# Patient Record
Sex: Female | Born: 1990
Health system: Southern US, Community
[De-identification: ages and names within clinical notes are randomized; demographics above are authoritative.]

## PROBLEM LIST (undated history)

## (undated) DIAGNOSIS — I2699 Other pulmonary embolism without acute cor pulmonale: Secondary | ICD-10-CM

## (undated) DIAGNOSIS — F32A Depression, unspecified: Secondary | ICD-10-CM

## (undated) DIAGNOSIS — F419 Anxiety disorder, unspecified: Secondary | ICD-10-CM

## (undated) DIAGNOSIS — F329 Major depressive disorder, single episode, unspecified: Secondary | ICD-10-CM

## (undated) DIAGNOSIS — T7840XA Allergy, unspecified, initial encounter: Secondary | ICD-10-CM

## (undated) HISTORY — DX: Other pulmonary embolism without acute cor pulmonale: I26.99

## (undated) HISTORY — DX: Anxiety disorder, unspecified: F41.9

## (undated) HISTORY — DX: Allergy, unspecified, initial encounter: T78.40XA

## (undated) HISTORY — DX: Depression, unspecified: F32.A

## (undated) HISTORY — DX: Major depressive disorder, single episode, unspecified: F32.9

---
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
## 2009-01-22 ENCOUNTER — Encounter: Payer: Self-pay | Admitting: Physician Assistant

## 2009-01-22 ENCOUNTER — Ambulatory Visit: Payer: Self-pay | Admitting: Family

## 2009-01-22 LAB — CONVERTED CEMR LAB
Antibody Screen: NEGATIVE
Rh Type: POSITIVE

## 2009-01-23 ENCOUNTER — Encounter: Payer: Self-pay | Admitting: Physician Assistant

## 2009-02-03 ENCOUNTER — Ambulatory Visit (HOSPITAL_COMMUNITY): Admission: RE | Admit: 2009-02-03 | Discharge: 2009-02-03 | Payer: Self-pay | Admitting: Obstetrics & Gynecology

## 2009-02-10 ENCOUNTER — Ambulatory Visit: Payer: Self-pay | Admitting: Family Medicine

## 2009-02-22 ENCOUNTER — Ambulatory Visit: Payer: Self-pay | Admitting: Family

## 2009-02-23 ENCOUNTER — Encounter: Payer: Self-pay | Admitting: Family

## 2009-02-23 LAB — CONVERTED CEMR LAB
Chlamydia, DNA Probe: NEGATIVE
GC Probe Amp, Genital: NEGATIVE
Trich, Wet Prep: NONE SEEN

## 2009-02-25 ENCOUNTER — Ambulatory Visit (HOSPITAL_COMMUNITY): Admission: RE | Admit: 2009-02-25 | Discharge: 2009-02-25 | Payer: Self-pay | Admitting: Obstetrics & Gynecology

## 2009-03-10 ENCOUNTER — Ambulatory Visit (HOSPITAL_COMMUNITY): Admission: RE | Admit: 2009-03-10 | Discharge: 2009-03-10 | Payer: Self-pay | Admitting: Obstetrics & Gynecology

## 2009-03-22 ENCOUNTER — Ambulatory Visit: Payer: Self-pay | Admitting: Family

## 2009-04-08 ENCOUNTER — Ambulatory Visit (HOSPITAL_COMMUNITY): Admission: RE | Admit: 2009-04-08 | Discharge: 2009-04-08 | Payer: Self-pay | Admitting: Obstetrics & Gynecology

## 2009-04-19 ENCOUNTER — Ambulatory Visit: Payer: Self-pay | Admitting: Family

## 2009-04-23 ENCOUNTER — Ambulatory Visit: Payer: Self-pay | Admitting: Physician Assistant

## 2009-05-21 ENCOUNTER — Ambulatory Visit: Payer: Self-pay | Admitting: Family

## 2009-05-21 LAB — CONVERTED CEMR LAB
HCT: 36.9 % (ref 36.0–46.0)
Hemoglobin: 12.6 g/dL (ref 12.0–15.0)
MCHC: 34.1 g/dL (ref 30.0–36.0)
MCV: 92.9 fL (ref 78.0–100.0)
Platelets: 170 10*3/uL (ref 150–400)
RBC: 3.97 M/uL (ref 3.87–5.11)
RDW: 13.5 % (ref 11.5–15.5)
WBC: 11.7 10*3/uL — ABNORMAL HIGH (ref 4.0–10.5)

## 2009-06-04 ENCOUNTER — Ambulatory Visit: Payer: Self-pay | Admitting: Obstetrics and Gynecology

## 2009-06-08 ENCOUNTER — Ambulatory Visit: Payer: Self-pay | Admitting: Advanced Practice Midwife

## 2009-06-08 ENCOUNTER — Inpatient Hospital Stay (HOSPITAL_COMMUNITY): Admission: AD | Admit: 2009-06-08 | Discharge: 2009-06-09 | Payer: Self-pay | Admitting: Family Medicine

## 2009-06-25 ENCOUNTER — Ambulatory Visit: Payer: Self-pay | Admitting: Physician Assistant

## 2009-06-28 ENCOUNTER — Inpatient Hospital Stay (HOSPITAL_COMMUNITY): Admission: AD | Admit: 2009-06-28 | Discharge: 2009-06-28 | Payer: Self-pay | Admitting: Obstetrics & Gynecology

## 2009-06-28 ENCOUNTER — Ambulatory Visit: Payer: Self-pay | Admitting: Obstetrics and Gynecology

## 2009-07-09 ENCOUNTER — Ambulatory Visit: Payer: Self-pay | Admitting: Physician Assistant

## 2009-07-16 ENCOUNTER — Ambulatory Visit: Payer: Self-pay | Admitting: Obstetrics and Gynecology

## 2009-07-16 LAB — CONVERTED CEMR LAB
Chlamydia, DNA Probe: NEGATIVE
Clue Cells Wet Prep HPF POC: NONE SEEN
GC Probe Amp, Genital: NEGATIVE
Trich, Wet Prep: NONE SEEN

## 2009-07-17 ENCOUNTER — Encounter: Payer: Self-pay | Admitting: Obstetrics and Gynecology

## 2009-07-23 ENCOUNTER — Ambulatory Visit: Payer: Self-pay | Admitting: Obstetrics and Gynecology

## 2009-08-02 ENCOUNTER — Ambulatory Visit: Payer: Self-pay | Admitting: Physician Assistant

## 2009-08-04 ENCOUNTER — Ambulatory Visit: Payer: Self-pay | Admitting: Obstetrics & Gynecology

## 2009-08-08 ENCOUNTER — Inpatient Hospital Stay (HOSPITAL_COMMUNITY): Admission: AD | Admit: 2009-08-08 | Discharge: 2009-08-11 | Payer: Self-pay | Admitting: Obstetrics and Gynecology

## 2009-08-08 ENCOUNTER — Ambulatory Visit: Payer: Self-pay | Admitting: Obstetrics and Gynecology

## 2009-08-09 ENCOUNTER — Encounter: Payer: Self-pay | Admitting: Obstetrics and Gynecology

## 2009-08-13 HISTORY — PX: CHOLECYSTECTOMY: SHX55

## 2009-09-20 ENCOUNTER — Ambulatory Visit: Payer: Self-pay | Admitting: Advanced Practice Midwife

## 2009-11-09 ENCOUNTER — Ambulatory Visit: Payer: Self-pay | Admitting: Obstetrics & Gynecology

## 2010-01-10 ENCOUNTER — Ambulatory Visit: Payer: Self-pay | Admitting: Family

## 2010-01-10 ENCOUNTER — Other Ambulatory Visit: Admission: RE | Admit: 2010-01-10 | Discharge: 2010-01-10 | Payer: Self-pay | Admitting: Obstetrics & Gynecology

## 2010-01-11 ENCOUNTER — Encounter: Payer: Self-pay | Admitting: Family

## 2010-01-11 LAB — CONVERTED CEMR LAB
Free T4: 1.08 ng/dL (ref 0.80–1.80)
T3, Free: 3.5 pg/mL (ref 2.3–4.2)

## 2010-03-02 ENCOUNTER — Ambulatory Visit: Payer: Self-pay | Admitting: Family Medicine

## 2010-03-02 LAB — CONVERTED CEMR LAB: Rapid Strep: NEGATIVE

## 2010-10-31 ENCOUNTER — Ambulatory Visit: Payer: Self-pay | Admitting: Family Medicine

## 2010-11-18 ENCOUNTER — Inpatient Hospital Stay (HOSPITAL_COMMUNITY)
Admission: AD | Admit: 2010-11-18 | Discharge: 2010-11-18 | Payer: Self-pay | Source: Home / Self Care | Attending: Family Medicine | Admitting: Family Medicine

## 2010-11-28 LAB — URINALYSIS, ROUTINE W REFLEX MICROSCOPIC
Bilirubin Urine: NEGATIVE
Ketones, ur: NEGATIVE mg/dL
Leukocytes, UA: NEGATIVE
Nitrite: NEGATIVE
Protein, ur: NEGATIVE mg/dL
Specific Gravity, Urine: 1.01 (ref 1.005–1.030)
Urine Glucose, Fasting: NEGATIVE mg/dL
Urobilinogen, UA: 0.2 mg/dL (ref 0.0–1.0)
pH: 7 (ref 5.0–8.0)

## 2010-11-28 LAB — GC/CHLAMYDIA PROBE AMP, GENITAL
Chlamydia, DNA Probe: NEGATIVE
GC Probe Amp, Genital: NEGATIVE

## 2010-11-28 LAB — WET PREP, GENITAL
Clue Cells Wet Prep HPF POC: NONE SEEN
Trich, Wet Prep: NONE SEEN
Yeast Wet Prep HPF POC: NONE SEEN

## 2010-11-28 LAB — URINE MICROSCOPIC-ADD ON

## 2010-11-28 LAB — POCT PREGNANCY, URINE: Preg Test, Ur: NEGATIVE

## 2010-12-04 ENCOUNTER — Encounter: Payer: Self-pay | Admitting: Family Medicine

## 2010-12-13 NOTE — Assessment & Plan Note (Signed)
Summary: FEVER AND SORE THROAT   Vital Signs:  Patient Profile:   20 Years Old Female CC:      fever, sore throat, body aches, sinus congestion  X 1 day Height:     65 inches Weight:      136 pounds O2 Sat:      98 % O2 treatment:    Room Air Temp:     101.4 degrees F oral Pulse rate:   120 / minute Pulse rhythm:   regular Resp:     14 per minute BP sitting:   108 / 75  (right arm) Cuff size:   regular  Pt. in pain?   yes    Location:   throat    Type:       sore  Vitals Entered By: Lajean Saver RN (March 02, 2010 9:29 AM)                   Prior Medication List:  ALLEGRA-D 12 HOUR 60-120 MG  TB12 (FEXOFENADINE-PSEUDOEPHEDRINE) 1 tab by mouth q 12 hrs as needed allergies FLONASE 50 MCG/ACT  SUSP (FLUTICASONE PROPIONATE) 2 sprays per nostril daily CIPRO 500 MG TABS (CIPROFLOXACIN HCL) 1 tab by mouth q 12 hrs x 5 days PYRIDIUM 200 MG TABS (PHENAZOPYRIDINE HCL) 1 tab by mouth three times a day x 2 days ONDANSETRON 8 MG TBDP (ONDANSETRON) 1 tab by mouth q 8 hrs as needed nausea   Updated Prior Medication List: No Medications Current Allergies (reviewed today): ! * SEASONALHistory of Present Illness Chief Complaint: fever, sore throat, body aches, sinus congestion  X 1 day History of Present Illness: Patient has had similar presentation before and then it was strep. Became sick yesterday w/ an elevated fever.  Current Problems: FEVER (ICD-780.60) ACUTE NASOPHARYNGITIS (ICD-460) NEED PROPHYLACTIC VACCINATION&INOCULATION FLU (ICD-V04.81) UTI'S, RECURRENT (ICD-599.0) URINARY TRACT INFECTION SITE NOT SPECIFIED (ICD-599.0) ALLERGIC RHINITIS CAUSE UNSPECIFIED (ICD-477.9)   REVIEW OF SYSTEMS Constitutional Symptoms       Complains of fever and fatigue.     Denies chills, night sweats, weight loss, and weight gain.      Comments: body aches Eyes       Denies change in vision, eye pain, eye discharge, glasses, contact lenses, and eye surgery. Ear/Nose/Throat/Mouth  Complains of sinus problems and sore throat.      Denies hearing loss/aids, change in hearing, ear pain, ear discharge, dizziness, frequent runny nose, frequent nose bleeds, hoarseness, and tooth pain or bleeding.  Respiratory       Denies dry cough, productive cough, wheezing, shortness of breath, asthma, bronchitis, and emphysema/COPD.  Cardiovascular       Denies murmurs, chest pain, and tires easily with exhertion.    Gastrointestinal       Denies stomach pain, nausea/vomiting, diarrhea, constipation, blood in bowel movements, and indigestion. Genitourniary       Denies painful urination, kidney stones, and loss of urinary control. Neurological       Denies paralysis, seizures, and fainting/blackouts. Musculoskeletal       Denies muscle pain, joint pain, joint stiffness, decreased range of motion, redness, swelling, muscle weakness, and gout.  Skin       Denies bruising, unusual mles/lumps or sores, and hair/skin or nail changes.  Psych       Denies mood changes, temper/anger issues, anxiety/stress, speech problems, depression, and sleep problems. 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 HS diploma. non-smoker Denies ETOH or drugs. Sexually active. 5 caffeine/ day. No regular exercise. 65 month old daughter  Risk Factors: Smoking Status: current (04/07/2008) Packs/Day: .25 (04/07/2008)  Past Medical History: depression/ anxiety allergies 7 months post partum  Past Surgical History:  Cholecystectomy 08/2009  Family History: Reviewed history from 04/07/2008 and no changes required. father DM, HTN, high chol mother depression brother depression sister healthy  Social History: Reviewed history from 04/07/2008 and no changes required. Office  Asst for [employer]. HS diploma. non-smoker Denies ETOH or drugs. Sexually active. 5 caffeine/ day. No regular exercise. 54 month old daughter Physical Exam General appearance: well developed, well nourished, no acute distress  Head: normocephalic, atraumatic Ears: normal, no lesions or deformities Nasal: pale, boggy, swollen nasal turbinates no tenderness over maxillatry sinuses Oral/Pharynx: unable to visualize tonsillar bed    Neck: neck supple,  trachea midline, no masses Skin: no obvious rashes or lesions MSE: oriented to time, place, and person not toxicnin apperenxce at this time  multiple attempts made by me to visualize tonsil bed and get a culture of throayt and patient unable /unwilling to cooperate. At this time not sure how good a specimen on the raopid was.Discused w/ patient and will give La Bicillin . Explained this uis only for strep and if something else is causing the fever she wikllnot be better and to be reasse in 48-72 hours if not bettter. Assessment New Problems: FEVER (ICD-780.60) ACUTE NASOPHARYNGITIS (ICD-460)  nasopharyngitis  Patient Education: Patient and/or caregiver instructed in the following: rest fluids and Tylenol.  Plan New Orders: Rapid Strep [98119] New Patient Level III [99203] Bicillin CR 1.2 million units Injection [J0558] Admin of Therapeutic Inj  intramuscular or subcutaneous [96372] Follow Up: Follow up on an as needed basis Work/School Excuse: Return to work/school in 2 days  The patient and/or caregiver has been counseled thoroughly with regard to medications prescribed including dosage, schedule, interactions, rationale for use, and possible side effects and they verbalize understanding.  Diagnoses and expected course of recovery discussed and will return if not improved as expected or if the condition worsens. Patient and/or caregiver verbalized understanding.   Patient Instructions: 1)  Recommended remaining out of  work for today and tomorrow 2)  Please schedule an appointment with your primary doctor in :48-72 hr sif not better. 3)  Patient given an injection of La Bicillin 1.2 million units IM w/she understanding if this is not strep she willl not get better and will need follow up.  Laboratory Results  Date/Time Received: March 02, 2010 10:30 AM  Date/Time Reported: March 02, 2010 10:30 AM   Other Tests  Rapid Strep: negative  Kit Test Internal QC: Negative   (Normal Range: Negative)    Medication Administration  Injection # 1:    Medication: Bicillin CR 1.2 million units Injection    Diagnosis: FEVER (ICD-780.60)    Route: IM    Site: RUOQ gluteus    Exp Date: 08/12/2012    Lot #: [id number]    Mfr: king    Patient tolerated injection without complications    Given by: Lajean Saver RN (March 02, 2010 10:31 AM)  Orders Added: 1)  Rapid Strep [95621] 2)  New Patient Level III [99203] 3)  Bicillin CR 1.2 million units Injection [J0558] 4)  Admin of Therapeutic Inj  intramuscular or subcutaneous [30865]

## 2010-12-13 NOTE — Letter (Signed)
Summary: Out of Work  [facility]  [address]   [place], [place] [address]   Phone: [phone]  Fax: [phone]    March 02, 2010   Employee:  GRACELIN WEISBERG    To Whom It May Concern:   For Medical reasons, please excuse the above named employee from work for the following dates:  Start:   03/02/2010  End:   03/04/2010  If you need additional information, please feel free to contact our office.         Sincerely,    Hassan Rowan MD

## 2011-01-27 ENCOUNTER — Telehealth: Payer: Self-pay | Admitting: Family Medicine

## 2011-01-28 ENCOUNTER — Encounter: Payer: Self-pay | Admitting: Family Medicine

## 2011-01-29 LAB — CONVERTED CEMR LAB
Amphetamine Screen, Ur: NEGATIVE
Barbiturate Quant, Ur: NEGATIVE
Benzodiazepines.: NEGATIVE
Cocaine Metabolites: NEGATIVE
Creatinine,U: 145.6 mg/dL
Marijuana Metabolite: NEGATIVE
Methadone: NEGATIVE
Opiate Screen, Urine: NEGATIVE
Phencyclidine (PCP): NEGATIVE
Propoxyphene: NEGATIVE

## 2011-01-31 NOTE — Progress Notes (Addendum)
Summary: Lab order to [facility]  Phone Note Call from Patient Call back at [phone]   Caller: Patient Summary of Call: Pt needs general drug screening test for court custody hearing on 02/03/11, pt will go to [facility] one afternoon next week Initial call taken by: Lannette Donath,  January 27, 2011 1:10 PM  Follow-up for Phone Call        ordered. Follow-up by: Seymour Bars DO,  January 27, 2011 2:41 PM     Appended Document: Lab order to [facility] informing Pt of the above

## 2011-02-17 LAB — CBC
HCT: 40 % (ref 36.0–46.0)
Hemoglobin: 13.1 g/dL (ref 12.0–15.0)
MCHC: 32.9 g/dL (ref 30.0–36.0)
MCV: 92.4 fL (ref 78.0–100.0)
Platelets: 181 10*3/uL (ref 150–400)
RBC: 4.32 MIL/uL (ref 3.87–5.11)
RDW: 13.1 % (ref 11.5–15.5)
WBC: 13.7 10*3/uL — ABNORMAL HIGH (ref 4.0–10.5)

## 2011-02-17 LAB — RPR: RPR Ser Ql: NONREACTIVE

## 2011-02-18 LAB — URINALYSIS, ROUTINE W REFLEX MICROSCOPIC
Bilirubin Urine: NEGATIVE
Glucose, UA: NEGATIVE mg/dL
Hgb urine dipstick: NEGATIVE
Ketones, ur: NEGATIVE mg/dL
Nitrite: NEGATIVE
Protein, ur: NEGATIVE mg/dL
Specific Gravity, Urine: 1.02 (ref 1.005–1.030)
Urobilinogen, UA: 0.2 mg/dL (ref 0.0–1.0)
pH: 6 (ref 5.0–8.0)

## 2011-02-19 LAB — WET PREP, GENITAL
Trich, Wet Prep: NONE SEEN
Yeast Wet Prep HPF POC: NONE SEEN

## 2011-02-19 LAB — GC/CHLAMYDIA PROBE AMP, GENITAL
Chlamydia, DNA Probe: NEGATIVE
GC Probe Amp, Genital: NEGATIVE

## 2011-02-19 LAB — URINE CULTURE: Colony Count: 70000

## 2011-02-19 LAB — URINALYSIS, ROUTINE W REFLEX MICROSCOPIC
Bilirubin Urine: NEGATIVE
Glucose, UA: NEGATIVE mg/dL
Hgb urine dipstick: NEGATIVE
Ketones, ur: NEGATIVE mg/dL
Nitrite: NEGATIVE
Protein, ur: NEGATIVE mg/dL
Specific Gravity, Urine: 1.01 (ref 1.005–1.030)
Urobilinogen, UA: 0.2 mg/dL (ref 0.0–1.0)
pH: 6 (ref 5.0–8.0)

## 2011-02-19 LAB — URINE MICROSCOPIC-ADD ON

## 2011-02-19 LAB — STREP B DNA PROBE

## 2011-03-28 NOTE — Assessment & Plan Note (Signed)
NAME:  [name], [name] NO.:  [id number]   MEDICAL RECORD NO.:  [id number]          PATIENT TYPE:  POB   LOCATION:  [facility] [place]         FACILITY:  [facility]   PHYSICIAN:  Jaynie Collins, MD     DATE OF BIRTH:  [birth year]   DATE OF SERVICE:  11/09/2009                                  CLINIC NOTE   HISTORY OF PRESENT ILLNESS:  The patient is a 20 year old gravida 1,  para 1, who underwent a Mirena IUD insertion on September 20, 2009 who is  here today to have an IUD string check.  The patient does report that  her partner has felt the strings, but she has no complaints.  She denies  any abnormal bleeding, discharge, or any other symptoms.   PHYSICAL EXAMINATION:  The patient's vital signs are stable.  On pelvic  examination, the patient had a normal external female genitalia and pink  well rugated vagina and IUD strings are visualized about 2 cm in length  from the external os.  Normal cervical contour and normal discharge  noted.   IMPRESSION:  The patient is a 20 year old gravida 1, para 1 here for  Mirena IUD string check.  The patient does have strings that are  visualized on examination.  The patient was counseled regarding  vaccination for her Staphylococcus Gardasil series.  She says she has  talked about this in the past and has not actually obtained it.  Her  last Pap smear that was done when she was pregnant was in February 2010  and the patient is sexually clean not eligible for cervical dysplasia  screening until age 20, but is at the perfect age for receiving the  Gardasil vaccination.  A discussion with her about the series of  injections, so she was given written information to review.  The patient  does want to proceed with this vaccination series.  She will get the  first injection today and when  she comes back for her annual exam in  February 2011,  she will get her Gardasil injection and in 6 months from  today she will get her third  injection.  The patient was told to call  back if she has any adverse symptoms or side effects of these  vaccinations.           ______________________________  Jaynie Collins, MD     UA/MEDQ  D:  11/09/2009  T:  11/10/2009  Job:  [id number]

## 2011-03-28 NOTE — Assessment & Plan Note (Signed)
NAME:  [name], [name] NO.:  [id number]   MEDICAL RECORD NO.:  [id number]          PATIENT TYPE:  POB   LOCATION:  [facility] [place]         FACILITY:  [facility]   PHYSICIAN:  Maylon Cos, CNM    DATE OF BIRTH:  03-23-1991   DATE OF SERVICE:  12/28/2008                                  CLINIC NOTE   Patient is an 20 year old nulligravida who presents for an annual exam  and Pap smear.  She has complaints of pain with intercourse that has  initiated approximately 6 weeks ago and complaint of decrease in vaginal  moisture during sexual intercourse that has been more pronounced in the  last 2 to 3 weeks.   PATIENT'S MEDICAL HISTORY:  SHE IS ALLERGIC TO LATEX AND CIGARETTE  SMOKE.  She is not currently on any medications.  Her immunization  status is unknown by patient.  Brief discussion on dating immunizations  given that she is reproductive age.  Her primary care Joren Rehm is [facility]; however, she has not seen them in approximately 2  years.   MENSTRUAL HISTORY:  First day of her last menstrual period was on  November 12, 2009.  She was 20 years old at menarche.  She has irregular  menstrual cycles varying days in between her cycles.  Her periods last  approximately 5 to 7 days.  She has a heavy to moderate flow and painful  periods.  She has no bleeding between her periods.   CONTRACEPTIVE HISTORY:  Until approximately 2 weeks ago, she was on  birth control pills; however, she is uncertain of the name of these  pills.  She stopped the pills secondary to recent onset of early morning  nausea and vomiting and late evening nausea and vomiting that she felt  was being made worse by her pills.   OBSTETRICAL HISTORY:  Patient was never pregnant before.   GYNECOLOGICAL HISTORY:  Last Pap smear was performed in 2009 by  physician at [facility] in [place].  It was found to be  abnormal and colpo was performed in October of 2009.  Patient  was to  return in 4 months for a repeat Pap smear; however, she did not and  returns today for her annual exam and Pap smear with [place].  Release of  information has been sent for those records.   SURGICAL HISTORY:  None.   FAMILY HISTORY:  Is positive for diabetes, heart disease, high blood  pressure, and cancer.   PERSONAL MEDICAL HISTORY:  Positive for depression and anxiety; however,  she is not currently on any medications and has not been on any  medications for either of these diagnoses in approximately 2 years.  Her  personal medical history is also positive for childhood asthma.   SOCIAL HISTORY:  She lives with her mother and her brother.  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  She is a nonsmoker.  She  does not drink alcoholic beverages.  She denies the use of addicting  drugs and denies history of sexual or physical abuse.  SYSTEMATIC REVIEW:  In addition to what was reviewed in the HPI, patient  has complaint of nausea/vomiting early in the morning mainly with the  smell of coffee and late in the evenings with the smell of coffee.  She  says that this is unusual for her as she is an avid coffee drinker and  has been unable to enjoy it for probably the last 3 to 4 weeks.  She has  had an increase in fatigue and dizzy spells.  She denies weight loss or  weight gain, chills, intolerance to heat or cold, difficulty with  urination, shortness of breath, chest pain, swelling in any of her  extremities, or frequent headaches.   EXAMINATION:  Today Vendetta is a petite but well-nourished, 20 year old,  Caucasian female who appears to be slightly younger than her stated age  of 55.  She is in no apparent distress; however, she does appear  slightly anxious and nervous during the examination today.  VITAL SIGNS:  Stable.  Her pulse is 95.  Her blood pressure is 120/75.  Her weight today is 106  and her height is 65 inches.  HEENT:  Grossly normal with good dentition.  She has no lymphadenopathy.  No thyromegaly.  HEART:  Regular rate and rhythm with no murmur or bruits.  LUNGS:  Clear to auscultation bilaterally, A and P.  BREASTS:  Slightly tender, symmetrical, and small in size without  masses, lumps, or lesions.  There is no dimpling or retracting of the  skin.  Nipples are erect without discharge.  ABDOMEN:  Nontender.  No masses.  LYMPH:  No lymphadenopathy.  GENITALIA:  She is a Tanner 5 with no external genital lesions.  Mucous  membranes are pink with irregular rugae and no lesions.  There is a  scant amount of a creamy white discharge noted that is non-odorous.  Her  cervix is nulligravid and nonfriable.  It is pink and smooth without  lesion.  Bimanual exam, her uterus does feel enlarged to approximately  the size of 7 weeks' gestation.  It is nontender.  Her adnexa are not  enlarged and nontender.  RECTAL EXAM:  Deferred.  EXTREMITIES:  Warm to touch with even hair distribution.  No edema and  equal pulses.   Urine pregnancy test was performed today and found to be positive.   IMPRESSION:  1. Amenorrhea secondary to pregnancy.  2. Pain with intercourse, probable secondary to pregnancy.   PLAN:  Patient was very surprised by her pregnancy testing positive  today secondary to a belief of uncertain reason as she did not believe  that she was able to become pregnant; however, she has never been told  this by a medical professional before.  Her options were reviewed at  length,  options being continuation of pregnancy, referral for  termination, referral for adoption.  Patient is uncertain but is leaning  towards continuing pregnancy but is uncertain about adoption.  She does  desire to make another appointment to initiate her routine prenatal  care.  Vitamin samples were given of Neevo.  Upon further discussion,  patient's first day of her last menstrual period  was given incorrectly  and the first day of her last menstrual period is actually November 06, 2008.  Patient should return in 3 weeks for new OB appointment or p.r.n.  problems.           ______________________________  Maylon Cos, CNM     SS/MEDQ  D:  12/28/2008  T:  12/28/2008  Job:  [id number]

## 2011-03-28 NOTE — Assessment & Plan Note (Signed)
NAME:  [name], [name] NO.:  [id number]   MEDICAL RECORD NO.:  [id number]          PATIENT TYPE:  POB   LOCATION:  [facility] [place]         FACILITY:  [facility]   PHYSICIAN:  Sid Falcon, CNM  DATE OF BIRTH:  April 17, 1991   DATE OF SERVICE:                                  CLINIC NOTE   REASON FOR VISIT:  Well-woman exam and second dose of Gardasil.  The  patient is here with reports of feeling increased nausea, feeling of  coldness, and weight gain of 5 pounds in the last month, concerned about  a possible pregnancy, does still have the Mirena IUD in place, reports  occasional spotting with the Mirena, sometimes it is annoying for  patient; however, wants to wait it out if she is not pregnant for the  spotting to resolve.  Reports an abnormal Pap smear with HPV reflex,  does not recall the type of abnormal Pap smear in the past.  Her Pap  last year was negative.   PHYSICAL EXAMINATION:  GENERAL:  Alert and oriented x3.  No sign of  acute distress.  VITAL SIGNS:  Stable, pulse 88, blood pressure 132/72, weight 135, and  height 5 feet 5 inches.  NECK:  Slight enlargement of the thyroid.  No dominant masses palpated.  CHEST:  Cardiovascular system regular rate and rhythm without murmurs,  gallops, or rubs.  LUNGS:  Clear to auscultation bilaterally.  BREASTS:  Soft, nontender, no dominant masses.  No nipple discharge.  No  retractions bilaterally.  ABDOMEN:  No hepatosplenomegaly.  Positive bowel sounds x4.  PELVIC:  No abnormal discharge.  No abnormal lesions.  Cervix  visualized.  IUD strings present.  No abnormal lesions on cervix,  negative cervical motion tenderness.  Adnexa nontender with palpation.  No dominant masses.  Uterus mobile, soft, nontender, midline.  Urine  pregnancy test negative.   ASSESSMENT:  1. Well-woman exam.  2. Enlarged thyroid.   PLAN:  Obtain TSH level, Pap smear to lab with GC and CT cultures.  The  patient is to follow up as  indicated.  We will continue the IUD as a  birth control method.  We will give second dose of Gardasil today.      Sid Falcon, CNM     WM/MEDQ  D:  01/10/2010  T:  01/11/2010  Job:  [id number]

## 2011-04-19 ENCOUNTER — Encounter: Payer: Self-pay | Admitting: Family Medicine

## 2011-04-20 ENCOUNTER — Ambulatory Visit (INDEPENDENT_AMBULATORY_CARE_PROVIDER_SITE_OTHER): Payer: 59 | Admitting: Family Medicine

## 2011-04-20 ENCOUNTER — Encounter: Payer: Self-pay | Admitting: Family Medicine

## 2011-04-20 VITALS — BP 105/72 | HR 104 | Temp 98.3°F | Ht 64.0 in | Wt 116.0 lb

## 2011-04-20 DIAGNOSIS — J019 Acute sinusitis, unspecified: Secondary | ICD-10-CM

## 2011-04-20 MED ORDER — AMOXICILLIN-POT CLAVULANATE 875-125 MG PO TABS: 1.0000 | ORAL_TABLET | Freq: Two times a day (BID) | ORAL | Status: AC

## 2011-04-20 NOTE — Patient Instructions (Signed)
Take Augmentin (antibiotic) 1 tab with breakfast and 1 with dinner for sinusitis.  Take OTC Allegra-D (ask the pharmacist for this) for allergies/ congestion/ drainage.  Use Ibuprofen as needed for aches/ pains. Rest, clear fluids.  Call if not resolved in 10 days.

## 2011-04-20 NOTE — Progress Notes (Signed)
  Subjective:    Patient ID: [name], female    DOB: 08-13-1991, 20 y.o.   MRN: [id number]  HPI 20 yo WF presents for 3 days of head congestion, ear pain, rhinorrhea and sneezing.  She has seasonal allergies and has been using Flonase.  She had a sinusitis 3-4 wks ago and took abx but it was also for a UTI.  She denies fevers but had chills.  She has a little cough but no SOB or chest tightness.  No sore throat.  She has a lot of HAs.  She is taking Dayquil and Nyquil which doesn't help much.  She has pain when she bends forward.    BP 105/72  Pulse 104  Temp(Src) 98.3 F (36.8 C) (Oral)  Ht 5\' 4"  (1.626 m)  Wt 116 lb (52.617 kg)  BMI 19.91 kg/m2  SpO2 100%   Review of Systems  Constitutional: Positive for chills, appetite change and fatigue. Negative for fever.  HENT: Positive for congestion, sore throat, rhinorrhea, sneezing and postnasal drip.   Eyes: Positive for pain. Negative for discharge and itching.  Respiratory: Positive for cough. Negative for chest tightness and shortness of breath.   Cardiovascular: Negative for chest pain.  Gastrointestinal: Negative for nausea, vomiting and diarrhea.  Skin: Negative for rash.  Neurological: Positive for headaches.       Objective:   Physical Exam  Constitutional: She appears well-developed and well-nourished.  HENT:  Head: Normocephalic and atraumatic.  Right Ear: A middle ear effusion (clear effusion) is present.  Left Ear: External ear normal.  Nose: Mucosal edema and rhinorrhea present. Right sinus exhibits maxillary sinus tenderness. Left sinus exhibits maxillary sinus tenderness.  Mouth/Throat: Posterior oropharyngeal erythema present. No oropharyngeal exudate or posterior oropharyngeal edema.  Eyes: Conjunctivae are normal.  Cardiovascular: Normal rate, regular rhythm and normal heart sounds.   No murmur heard. Pulmonary/Chest: Effort normal and breath sounds normal. No respiratory distress. She has no wheezes.    Lymphadenopathy:    She has cervical adenopathy.  Skin: Skin is warm and dry.          Assessment & Plan:  Acute Bacterial Sinusitis secondary to underlying seasonal allergies.  Will treat with Augmentin 2 x a day with food x 10 days. Use Allegra D for underlying allergies/ congestion. Supportive care + ibuprofen. Call if any problems or if not improved in 10 days.

## 2011-07-24 ENCOUNTER — Ambulatory Visit: Payer: 59 | Admitting: Family Medicine

## 2011-07-24 DIAGNOSIS — Z0289 Encounter for other administrative examinations: Secondary | ICD-10-CM

## 2011-10-03 ENCOUNTER — Encounter: Payer: Self-pay | Admitting: *Deleted

## 2011-10-03 ENCOUNTER — Emergency Department
Admission: EM | Admit: 2011-10-03 | Discharge: 2011-10-03 | Disposition: A | Discharge status: Home / Self Care | Payer: 59 | Source: Home / Self Care | Attending: Emergency Medicine | Admitting: Emergency Medicine

## 2011-10-03 DIAGNOSIS — L03211 Cellulitis of face: Secondary | ICD-10-CM

## 2011-10-03 DIAGNOSIS — L0201 Cutaneous abscess of face: Secondary | ICD-10-CM

## 2011-10-03 MED ORDER — SULFAMETHOXAZOLE-TRIMETHOPRIM 800-160 MG PO TABS: 1.0000 | ORAL_TABLET | Freq: Two times a day (BID) | ORAL | Status: AC

## 2011-10-03 MED ORDER — DOXYCYCLINE HYCLATE 100 MG PO CAPS: 100.0000 mg | ORAL_CAPSULE | Freq: Two times a day (BID) | ORAL | Status: AC

## 2011-10-03 NOTE — ED Provider Notes (Signed)
History     CSN: [id number] Arrival date & time: 10/03/2011  4:03 PM   First MD Initiated Contact with Patient 10/03/11 1558      Chief Complaint  Patient presents with  . Facial Swelling  . Wound Infection    (Consider location/radiation/quality/duration/timing/severity/associated sxs/prior treatment) HPI This 20 year old white female presents with her father today complaining of an abscess and swelling and infection on the right side of her face. It has been there for about a week however over the last few days it has been progressively worsening. She did try to pop it and also use a sterilized needle to try to pop it but that did not work. It has been progressively getting slightly larger and more painful over the last few days. She is just here to check to make sure to see if it is infected or not. She does feel that there is also swelling further down on her neck as well. 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  Past Medical History  Diagnosis Date  . Depression   . Anxiety   . Allergy   . Gave birth to child recently     Past Surgical History  Procedure Date  . Cholecystectomy 08-2009    Family History  Problem Relation Age of Onset  . Depression Mother   . Diabetes Father   . Hypertension Father   . Hyperlipidemia Father   . Depression Brother     History  Substance Use Topics  . Smoking status: Former Games developer  . Smokeless tobacco: Not on file  . Alcohol Use: Yes     once week    OB History    Grav Para Term Preterm Abortions TAB SAB Ect Mult Living                  Review of Systems  Allergies  Latex  Home Medications   Current Outpatient Rx  Name Route Sig Dispense Refill  . DOXYCYCLINE HYCLATE 100 MG PO CAPS Oral Take 1 capsule (100 mg total) by mouth 2 (two) times daily. 20 capsule 0  . FLUTICASONE PROPIONATE 50 MCG/ACT NA SUSP Nasal Place 2 sprays into the nose daily.      .  SULFAMETHOXAZOLE-TRIMETHOPRIM 800-160 MG PO TABS Oral Take 1 tablet by mouth 2 (two) times daily. 20 tablet 0    BP 111/78  Pulse 87  Temp(Src) 98.7 F (37.1 C) (Oral)  Resp 14  Ht 5\' 5"  (1.651 m)  Wt 111 lb (50.349 kg)  BMI 18.47 kg/m2  SpO2 99%  Physical Exam  Nursing note and vitals reviewed. Constitutional: She is oriented to person, place, and time. She appears well-developed and well-nourished.  HENT:  Head: Normocephalic and atraumatic.         Abscess is 1.5cm in size with induration, no fluctuance. There is tenderness. There are associated lymphadenopathy with it as well. There is slight swelling and erythema surrounding it up to about 2 cm. Her right ear exam is normal, her nose exam is normal, and her throat exam is normal.  Neck: Neck supple.  Cardiovascular: Regular rhythm and normal heart sounds.   Pulmonary/Chest: Effort normal and breath sounds normal. No respiratory distress.  Neurological: She is alert and oriented to person, place, and time.  Skin: Skin is warm and dry.  Psychiatric: She has a normal mood and affect. Her speech is normal.    ED Course  Procedures (including critical care time)  Labs Reviewed - No data to display No results found.   1. Abscess of face       MDM   Due to the location on her face, I do not feel comfortable doing an incision and drainage at this point. Instead we will give her a prescription for 2 antibiotics (doxycycline and Bactrim DS). Both of these should cover the likely culprit which is a staph infection. In addition to that I have advised her to use warm compresses frequently throughout the day to try to bring it to a head. In addition I have given her a culture swab just in case it does burst open that she can do a wound culture and then returned back to the clinic so that we can sent to the laboratory. She can continue to use ibuprofen and Tylenol as needed for pain. If she is progressively getting worse including  any fever or worsening swelling or worsening pain, I advised that she call her primary care physician or call our clinic. I am not sure if we would do the incision and drainage or send her to a plastic surgeon or ENT to have them do it due to her age and the location of the face. I have also advised that she clean her work headset, cell phone, and pillow case so that she is not re-introducing any kind of infection back onto her face.    Lily Kocher, MD 10/03/11 1620

## 2011-10-03 NOTE — ED Notes (Signed)
Patient presents with infection to right side of face. Started 4 days ago as a small "pimple". Last night the induration significantly grew in size. She has taken aleve and Advil for the pain.

## 2011-10-10 ENCOUNTER — Other Ambulatory Visit: Payer: Self-pay | Admitting: Emergency Medicine

## 2011-10-13 LAB — WOUND CULTURE

## 2012-02-16 ENCOUNTER — Encounter: Payer: Self-pay | Admitting: Family Medicine

## 2012-02-16 ENCOUNTER — Ambulatory Visit (INDEPENDENT_AMBULATORY_CARE_PROVIDER_SITE_OTHER): Payer: 59 | Admitting: Family Medicine

## 2012-02-16 VITALS — BP 98/63 | HR 93 | Ht 64.0 in | Wt 112.0 lb

## 2012-02-16 DIAGNOSIS — Z0000 Encounter for general adult medical examination without abnormal findings: Secondary | ICD-10-CM

## 2012-02-16 NOTE — Patient Instructions (Addendum)
Remember to get your tetanus shot.   Start a regular exercise program and make sure you are eating a healthy diet Try to eat 4 servings of dairy a day or take a calcium supplement (500mg  twice a day). Your vaccines are up to date.

## 2012-02-16 NOTE — Progress Notes (Signed)
  Subjective:     [name] is a 21 y.o. female and is here for a comprehensive physical exam. The patient reports no problems.  History   Social History  . Marital Status: Single    Spouse Name: N/A    Number of Children: 1  . Years of Education: N/A   Occupational History  . police dispatcher     [employer]    Social History Main Topics  . Smoking status: Former Smoker    Quit date: 11/14/2007  . Smokeless tobacco: Not on file  . Alcohol Use: Yes     once every coupld of month.   . Drug Use: No  . Sexually Active: Yes -- Female partner(s)      HS diploma, 5 caffeine drinks daily,no regular exercise.   Other Topics Concern  . Not on file   Social History Narrative   Exercise 5-6 days per week.    Health Maintenance  Topic Date Due  . Pap Smear  03/28/2008  . Tetanus/tdap  03/28/2009  . Influenza Vaccine  08/13/2012    The following portions of the patient's history were reviewed and updated as appropriate: allergies, current medications, past family history, past medical history, past social history, past surgical history and problem list.  Review of Systems A comprehensive review of systems was negative.   Objective:    BP 98/63  Pulse 93  Ht 5\' 4"  (1.626 m)  Wt 112 lb (50.803 kg)  BMI 19.22 kg/m2 General appearance: alert, cooperative and appears stated age Head: Normocephalic, without obvious abnormality, atraumatic Eyes: conj clear, EOMi, PEERLA Ears: normal TM's and external ear canals both ears Nose: Nares normal. Septum midline. Mucosa normal. No drainage or sinus tenderness. Throat: lips, mucosa, and tongue normal; teeth and gums normal Neck: no adenopathy, no carotid bruit, no JVD, supple, symmetrical, trachea midline and thyroid not enlarged, symmetric, no tenderness/mass/nodules Back: symmetric, no curvature. ROM normal. No CVA tenderness. Lungs: clear to auscultation bilaterally Heart: regular rate and rhythm, S1, S2 normal, no murmur,  click, rub or gallop Abdomen: soft, non-tender; bowel sounds normal; no masses,  no organomegaly Extremities: extremities normal, atraumatic, no cyanosis or edema Pulses: 2+ and symmetric Skin: Skin color, texture, turgor normal. No rashes or lesions Lymph nodes: Cervical, supraclavicular, and axillary nodes normal. Neurologic: Alert and oriented X 3, normal strength and tone. Normal symmetric reflexes. Normal coordination and gait    Assessment:    Healthy female exam.      Plan:     See After Visit Summary for Counseling Recommendations   Start a regular exercise program and make sure you are eating a healthy diet Try to eat 4 servings of dairy a day or take a calcium supplement (500mg  twice a day). Your vaccines are up to date.  She has gyn exam sched for later this month Will drop off her form work for [place] to complete.  Go for fasting labs when able. CMP and Lipids.  Declined Tdap today but will think about it.

## 2012-09-16 ENCOUNTER — Emergency Department
Admission: EM | Admit: 2012-09-16 | Discharge: 2012-09-16 | Disposition: A | Discharge status: Home / Self Care | Payer: 59 | Source: Home / Self Care | Attending: Family Medicine | Admitting: Family Medicine

## 2012-09-16 DIAGNOSIS — J3089 Other allergic rhinitis: Secondary | ICD-10-CM

## 2012-09-16 DIAGNOSIS — J321 Chronic frontal sinusitis: Secondary | ICD-10-CM

## 2012-09-16 DIAGNOSIS — J309 Allergic rhinitis, unspecified: Secondary | ICD-10-CM

## 2012-09-16 MED ORDER — PREDNISONE 20 MG PO TABS: 20.0000 mg | ORAL_TABLET | Freq: Two times a day (BID) | ORAL | Status: DC

## 2012-09-16 MED ORDER — FLUTICASONE PROPIONATE 50 MCG/ACT NA SUSP: 2.0000 | Freq: Every day | NASAL | Status: DC

## 2012-09-16 MED ORDER — AMOXICILLIN 875 MG PO TABS: 875.0000 mg | ORAL_TABLET | Freq: Two times a day (BID) | ORAL | Status: DC

## 2012-09-16 NOTE — ED Provider Notes (Signed)
History     CSN: [id number]  Arrival date & time 09/16/12  1820   First MD Initiated Contact with Patient 09/16/12 1904      Chief Complaint  Patient presents with  . Nasal Congestion    x 2 weeks      HPI Comments: Patient complains of increased sinus congestion for about two weeks.  She has a history of perennial rhinitis that has not responded to her usual Claritin D.  She denies cough.  Complains of fatigue and  myalgias.  She has now developed low grade fever and nausea each morning.   There has been no pleuritic pain, shortness of breath, or wheezes.   She has also developed a frontal headache and the color of her nasal drainage has darkened.  The history is provided by the patient.    Past Medical History  Diagnosis Date  . Depression   . Anxiety   . Allergy     Past Surgical History  Procedure Date  . Cholecystectomy 08-2009    Family History  Problem Relation Age of Onset  . Depression Mother   . Diabetes Father   . Hypertension Father   . Hyperlipidemia Father   . Depression Brother     History  Substance Use Topics  . Smoking status: Former Smoker    Quit date: 11/14/2007  . Smokeless tobacco: Not on file  . Alcohol Use: Yes     Comment: once every coupld of month.     OB History    Grav Para Term Preterm Abortions TAB SAB Ect Mult Living                  Review of Systems + sore throat No cough No pleuritic pain No wheezing + nasal congestion + post-nasal drainage + sinus pain/pressure No itchy/red eyes ? earache No hemoptysis No SOB + low grade fever, + chills + nausea No vomiting No abdominal pain No diarrhea No urinary symptoms No skin rashes + fatigue + myalgias + headache Used OTC meds without relief  Allergies  Latex  Home Medications   Current Outpatient Rx  Name  Route  Sig  Dispense  Refill  . FLUTICASONE PROPIONATE 50 MCG/ACT NA SUSP   Nasal   Place 2 sprays into the nose daily.           Marland Kitchen  LORATADINE-PSEUDOEPHEDRINE ER 10-240 MG PO TB24   Oral   Take 1 tablet by mouth daily.         . AMOXICILLIN 875 MG PO TABS   Oral   Take 1 tablet (875 mg total) by mouth 2 (two) times daily.   20 tablet   0   . FLUTICASONE PROPIONATE 50 MCG/ACT NA SUSP   Nasal   Place 2 sprays into the nose daily.   16 g   1   . PREDNISONE 20 MG PO TABS   Oral   Take 1 tablet (20 mg total) by mouth 2 (two) times daily. Take with food.   10 tablet   0     BP 109/77  Pulse 87  Temp 98 F (36.7 C) (Oral)  Resp 18  Wt 115 lb (52.164 kg)  SpO2 100%  Physical Exam Nursing notes and Vital Signs reviewed. Appearance:  Patient appears healthy, stated age, and in no acute distress Eyes:  Pupils are equal, round, and reactive to light and accomodation.  Extraocular movement is intact.  Conjunctivae are not inflamed  Ears:  Canals  normal.  Tympanic membranes normal.  Nose:  Moderately congested turbinates.  Frontal sinus tenderness is present.  Pharynx:  Normal Neck:  Supple.  Slightly tender shotty anterior/posterior nodes are palpated bilaterally  Lungs:  Clear to auscultation.  Breath sounds are equal.  Heart:  Regular rate and rhythm without murmurs, rubs, or gallops.  Abdomen:  Nontender without masses or hepatosplenomegaly.  Bowel sounds are present.  No CVA or flank tenderness.  Extremities:  No edema.  No calf tenderness Skin:  No rash present.   ED Course  Procedures        1. Frontal sinusitis   2. Perennial allergic rhinitis       MDM  Begin prednisone burst and course of amoxicillin.  Begin Flonase nasal spray. Take Mucinex D (guaifenesin with decongestant) twice daily for congestion.  Increase fluid intake, rest. Switch to plain Allegra or Zyrtec daily, then rotate ever 6 to 8 weeks. May use Afrin nasal spray (or generic oxymetazoline) twice daily for about 5 days.  Also recommend using saline nasal spray several times daily and saline nasal irrigation (AYR is a  common brand) Use Flonase after using Afrin spray. Followup with ENT if not improving.        Lattie Haw, MD 09/17/12 [phone]

## 2012-09-16 NOTE — ED Notes (Signed)
[name] has a history of seasonal allergies. She has had nasal congestion, hoarseness, sore throat and body aches for 2 weeks. She is taking Claritin D daily with little relief.

## 2012-09-18 ENCOUNTER — Telehealth: Payer: Self-pay | Admitting: Emergency Medicine

## 2013-04-29 ENCOUNTER — Emergency Department
Admission: EM | Admit: 2013-04-29 | Discharge: 2013-04-29 | Disposition: A | Discharge status: Home / Self Care | Payer: 59 | Source: Home / Self Care | Attending: Emergency Medicine | Admitting: Emergency Medicine

## 2013-04-29 ENCOUNTER — Encounter: Payer: Self-pay | Admitting: Emergency Medicine

## 2013-04-29 DIAGNOSIS — J069 Acute upper respiratory infection, unspecified: Secondary | ICD-10-CM

## 2013-04-29 MED ORDER — AZITHROMYCIN 250 MG PO TABS: ORAL_TABLET | ORAL | Status: DC

## 2013-04-29 NOTE — ED Provider Notes (Signed)
History     CSN: [id number]  Arrival date & time 04/29/13  4098   First MD Initiated Contact with Patient 04/29/13 1940      Chief Complaint  Patient presents with  . Nasal Congestion  . Otalgia  . Headache  . Sore Throat    (Consider location/radiation/quality/duration/timing/severity/associated sxs/prior treatment) HPI [name] is a 22 y.o. female who complains of onset of cold symptoms for 2 days.  The symptoms are constant and mild-moderate in severity.  Slightly better this evening.  No known sick exposure.  Not using any OTC meds.  Has a history of allergic rhinitis but not taking any antihistamines. + sore throat +/- cough No pleuritic pain No wheezing + nasal congestion + post-nasal drainage + sinus pain/pressure No chest congestion No itchy/red eyes No earache No hemoptysis No SOB No chills/sweats + fever (to 101 F) No nausea No vomiting No abdominal pain No diarrhea No skin rashes No fatigue No myalgias No headache    Past Medical History  Diagnosis Date  . Depression   . Anxiety   . Allergy     Past Surgical History  Procedure Laterality Date  . Cholecystectomy  08-2009    Family History  Problem Relation Age of Onset  . Depression Mother   . Diabetes Father   . Hypertension Father   . Hyperlipidemia Father   . Depression Brother     History  Substance Use Topics  . Smoking status: Former Smoker    Quit date: 11/14/2007  . Smokeless tobacco: Not on file  . Alcohol Use: Yes     Comment: once every coupld of month.     OB History   Grav Para Term Preterm Abortions TAB SAB Ect Mult Living                  Review of Systems  All other systems reviewed and are negative.    Allergies  Latex  Home Medications   Current Outpatient Rx  Name  Route  Sig  Dispense  Refill  . amoxicillin (AMOXIL) 875 MG tablet   Oral   Take 1 tablet (875 mg total) by mouth 2 (two) times daily.   20 tablet   0   . azithromycin (ZITHROMAX  Z-PAK) 250 MG tablet      Use as directed   1 each   0   . fluticasone (FLONASE) 50 MCG/ACT nasal spray   Nasal   Place 2 sprays into the nose daily.           . fluticasone (FLONASE) 50 MCG/ACT nasal spray   Nasal   Place 2 sprays into the nose daily.   16 g   1   . loratadine-pseudoephedrine (CLARITIN-D 24-HOUR) 10-240 MG per 24 hr tablet   Oral   Take 1 tablet by mouth daily.         . predniSONE (DELTASONE) 20 MG tablet   Oral   Take 1 tablet (20 mg total) by mouth 2 (two) times daily. Take with food.   10 tablet   0     BP 110/75  Pulse 114  Temp(Src) 98.1 F (36.7 C) (Oral)  Resp 16  Ht 5\' 5"  (1.651 m)  Wt 124 lb 8 oz (56.473 kg)  BMI 20.72 kg/m2  SpO2 98%  Physical Exam  Nursing note and vitals reviewed. Constitutional: She is oriented to person, place, and time. She appears well-developed and well-nourished. She does not appear ill.  HENT:  Head:  Normocephalic and atraumatic.  Right Ear: Tympanic membrane, external ear and ear canal normal.  Left Ear: Tympanic membrane, external ear and ear canal normal.  Nose: Mucosal edema present.  Mouth/Throat: No oropharyngeal exudate, posterior oropharyngeal edema or posterior oropharyngeal erythema.  Eyes: No scleral icterus.  Neck: Neck supple.  Cardiovascular: Regular rhythm and normal heart sounds.   Pulmonary/Chest: Effort normal and breath sounds normal. No respiratory distress. She has no decreased breath sounds. She has no wheezes.  Neurological: She is alert and oriented to person, place, and time.  Skin: Skin is warm and dry.  Psychiatric: She has a normal mood and affect. Her speech is normal.    ED Course  Procedures (including critical care time)  Labs Reviewed - No data to display No results found.   1. Acute upper respiratory infections of unspecified site       MDM  1)  a prescription for Z-Pak was given to the patient, however I counseled her that she should not take it for the  next 5-7 days especially since is most likely a viral syndrome.  However she's getting significantly worse with fevers and other worsening symptoms that are severe, then she can take the medicine. 2)  Use nasal saline solution (over the counter) at least 3 times a day. 3)  Use over the counter decongestants like Zyrtec-D every 12 hours as needed to help with congestion.  If you have hypertension, do not take medicines with sudafed.  4)  Can take tylenol every 6 hours or motrin every 8 hours for pain or fever. 5)  Follow up with your primary doctor if no improvement in 5-7 days, sooner if increasing pain, fever, or new symptoms.     Marlaine Hind, MD 04/29/13 [phone]

## 2013-04-29 NOTE — ED Notes (Signed)
Patient reports 2 days hx of congestion, cough, sore throat, hoarseness and headache.

## 2013-07-28 ENCOUNTER — Encounter: Payer: Self-pay | Admitting: Family Medicine

## 2013-07-28 ENCOUNTER — Ambulatory Visit (INDEPENDENT_AMBULATORY_CARE_PROVIDER_SITE_OTHER): Payer: 59 | Admitting: Family Medicine

## 2013-07-28 VITALS — BP 100/65 | HR 69 | Temp 98.8°F | Wt 125.0 lb

## 2013-07-28 DIAGNOSIS — J45909 Unspecified asthma, uncomplicated: Secondary | ICD-10-CM

## 2013-07-28 DIAGNOSIS — J209 Acute bronchitis, unspecified: Secondary | ICD-10-CM | POA: Insufficient documentation

## 2013-07-28 DIAGNOSIS — J019 Acute sinusitis, unspecified: Secondary | ICD-10-CM

## 2013-07-28 MED ORDER — AZITHROMYCIN 250 MG PO TABS: ORAL_TABLET | ORAL | Status: DC

## 2013-07-28 MED ORDER — ALBUTEROL SULFATE HFA 108 (90 BASE) MCG/ACT IN AERS: 2.0000 | INHALATION_SPRAY | Freq: Four times a day (QID) | RESPIRATORY_TRACT | Status: DC | PRN

## 2013-07-28 NOTE — Progress Notes (Signed)
  Subjective:    Patient ID: [name], female    DOB: May 25, 1991, 22 y.o.   MRN: [id number]  HPI 6 days of nasal congestion with green and clear nasal drianage. Using some zyrtec. No fever.   No longer using her Flonase. Last treated for acute sinusitis 3 months ago in June. She was treated with azithromycin. Says feels like it is draining into her chest and having some chest pressure.  Had some facial pressure initially but that is acutally better. + HA.  Has been using come mucinex-D.   Hx of childhood asthma.   Review of Systems     Objective:   Physical Exam  Constitutional: She is oriented to person, place, and time. She appears well-developed and well-nourished.  HENT:  Head: Normocephalic and atraumatic.  Right Ear: External ear normal.  Left Ear: External ear normal.  Nose: Nose normal.  Mouth/Throat: Oropharynx is clear and moist.  TMs and canals are clear.   Eyes: Conjunctivae and EOM are normal. Pupils are equal, round, and reactive to light.  Neck: Neck supple. No thyromegaly present.  Cardiovascular: Normal rate, regular rhythm and normal heart sounds.   Pulmonary/Chest: Effort normal and breath sounds normal. She has no wheezes.  Lymphadenopathy:    She has no cervical adenopathy.  Neurological: She is alert and oriented to person, place, and time.  Skin: Skin is warm and dry.  Psychiatric: She has a normal mood and affect.          Assessment & Plan:  Acute sinutis vs URI - explained her that since her sinus symptoms are actually little bit better and is now moving in her chest this is most likely viral. We are seeing similar symptoms going around the community currently. Typically lasting about a week and half to 2 weeks. If her reassurance. Continue symptomatic care. I did go ahead and give her prescription for azithromycin today if she feels like her sinus symptoms suddenly worsen or she spikes a temperature. Call if not better in one to 2 weeks. She did  have childhood asthma and has noticed some wheezing in the last day or 2. As in her prescription from you from [facility]. Reminded her on inhaler technique since it has been a while since she has used one. Call if any increase in shortness of breath or edema Ladona Ridgel is not relieving her respiratory symptoms.

## 2013-10-01 ENCOUNTER — Emergency Department
Admission: EM | Admit: 2013-10-01 | Discharge: 2013-10-01 | Disposition: A | Discharge status: Home / Self Care | Payer: BC Managed Care – PPO | Source: Home / Self Care | Attending: Family Medicine | Admitting: Family Medicine

## 2013-10-01 ENCOUNTER — Encounter: Payer: Self-pay | Admitting: Emergency Medicine

## 2013-10-01 DIAGNOSIS — J069 Acute upper respiratory infection, unspecified: Secondary | ICD-10-CM

## 2013-10-01 MED ORDER — AZITHROMYCIN 250 MG PO TABS: ORAL_TABLET | ORAL | Status: DC

## 2013-10-01 MED ORDER — PREDNISONE 20 MG PO TABS: 20.0000 mg | ORAL_TABLET | Freq: Two times a day (BID) | ORAL | Status: DC

## 2013-10-01 MED ORDER — BENZONATATE 200 MG PO CAPS: 200.0000 mg | ORAL_CAPSULE | Freq: Every day | ORAL | Status: DC

## 2013-10-01 NOTE — ED Provider Notes (Signed)
CSN: [id number]     Arrival date & time 10/01/13  1445 History   First MD Initiated Contact with Patient 10/01/13 1523     Chief Complaint  Patient presents with  . Sore Throat      HPI Comments: Patient complains of awakening with a severe sore throat, chills, fatigue, myalgias, and shortness of breath this morning.  No cough.  She had asthma as a child.  She feels tight in her anterior chest.  The history is provided by the patient.    Past Medical History  Diagnosis Date  . Depression   . Anxiety   . Allergy    Past Surgical History  Procedure Laterality Date  . Cholecystectomy  08-2009   Family History  Problem Relation Age of Onset  . Depression Mother   . Diabetes Father   . Hypertension Father   . Hyperlipidemia Father   . Depression Brother    History  Substance Use Topics  . Smoking status: Former Smoker    Quit date: 11/14/2007  . Smokeless tobacco: Not on file  . Alcohol Use: Yes     Comment: once every coupld of month.    OB History   Grav Para Term Preterm Abortions TAB SAB Ect Mult Living                 Review of Systems + sore throat No cough No pleuritic pain, but has tightness in anterior chest No wheezing + nasal congestion ? post-nasal drainage No sinus pain/pressure No itchy/red eyes No earache No hemoptysis No SOB ? fever, + chills No nausea No vomiting No abdominal pain No diarrhea No urinary symptoms No skin rash + fatigue + myalgias No headache Used OTC meds without relief  Allergies  Latex  Home Medications   Current Outpatient Rx  Name  Route  Sig  Dispense  Refill  . albuterol (PROVENTIL HFA;VENTOLIN HFA) 108 (90 BASE) MCG/ACT inhaler   Inhalation   Inhale 2 puffs into the lungs every 6 (six) hours as needed for wheezing.   1 Inhaler   2   . azithromycin (ZITHROMAX Z-PAK) 250 MG tablet      Take 2 tabs today; then begin one tab once daily for 4 more days. (Rx void after 10/09/13)   6 each   0   .  benzonatate (TESSALON) 200 MG capsule   Oral   Take 1 capsule (200 mg total) by mouth at bedtime. Take as needed for cough   12 capsule   0   . predniSONE (DELTASONE) 20 MG tablet   Oral   Take 1 tablet (20 mg total) by mouth 2 (two) times daily.   10 tablet   0    BP 111/73  Pulse 96  Temp(Src) 98.1 F (36.7 C) (Oral)  Resp 16  Wt 134 lb (60.782 kg)  SpO2 100% Physical Exam Nursing notes and Vital Signs reviewed. Appearance:  Patient appears healthy, stated age, and in no acute distress Eyes:  Pupils are equal, round, and reactive to light and accomodation.  Extraocular movement is intact.  Conjunctivae are not inflamed  Ears:  Canals normal.  Tympanic membranes normal.  Nose:  Mildly congested turbinates.  No sinus tenderness.    Pharynx:  Normal Neck:  Supple.  Tender shotty posterior nodes are palpated bilaterally  Lungs:  Clear to auscultation.  Breath sounds are equal.  Heart:  Regular rate and rhythm without murmurs, rubs, or gallops.  Abdomen:  Nontender without masses  or hepatosplenomegaly.  Bowel sounds are present.  No CVA or flank tenderness.  Extremities:  No edema.  No calf tenderness Skin:  No rash present.   ED Course  Procedures  none  Labs Reviewed  POCT RAPID STREP A (OFFICE) negative         MDM   1. Acute upper respiratory infections of unspecified site; suspect early viral URI    There is no evidence of bacterial infection today.   Begin prednisone burst (history of childhood asthma).  Prescription written for Benzonatate [facility]) to take at bedtime for night-time cough.  Take Mucinex D (guaifenesin with decongestant) twice daily for congestion.  Increase fluid intake, rest. May use Afrin nasal spray (or generic oxymetazoline) twice daily for about 5 days.  Also recommend using saline nasal spray several times daily and saline nasal irrigation (AYR is a common brand) Stop all antihistamines for now, and other non-prescription cough/cold  preparations. May continue albuterol inhaler if needed. Begin Azithromycin if not improving about one week or if persistent fever develops (Given a prescription to hold, with an expiration date)  Follow-up with family doctor if not improving about10 days.     Lattie Haw, MD 10/04/13 [phone]

## 2013-10-01 NOTE — ED Notes (Signed)
Pt c/o sore throat, post nasal drip, chills, fatigue and SOB with exercise x today. Denies fever.

## 2013-10-05 ENCOUNTER — Telehealth: Payer: Self-pay

## 2013-10-05 NOTE — ED Notes (Signed)
Unable to leave message due to the fact her mail box is full.

## 2013-11-07 ENCOUNTER — Emergency Department
Admission: EM | Admit: 2013-11-07 | Discharge: 2013-11-07 | Disposition: A | Discharge status: Home / Self Care | Payer: BC Managed Care – PPO | Source: Home / Self Care | Attending: Family Medicine | Admitting: Family Medicine

## 2013-11-07 ENCOUNTER — Encounter: Payer: Self-pay | Admitting: Emergency Medicine

## 2013-11-07 DIAGNOSIS — R509 Fever, unspecified: Secondary | ICD-10-CM

## 2013-11-07 DIAGNOSIS — R05 Cough: Secondary | ICD-10-CM

## 2013-11-07 DIAGNOSIS — R059 Cough, unspecified: Secondary | ICD-10-CM

## 2013-11-07 DIAGNOSIS — J111 Influenza due to unidentified influenza virus with other respiratory manifestations: Secondary | ICD-10-CM

## 2013-11-07 MED ORDER — OSELTAMIVIR PHOSPHATE 75 MG PO CAPS: 75.0000 mg | ORAL_CAPSULE | Freq: Two times a day (BID) | ORAL | Status: DC

## 2013-11-07 MED ORDER — BENZONATATE 200 MG PO CAPS: 200.0000 mg | ORAL_CAPSULE | Freq: Every day | ORAL | Status: DC

## 2013-11-07 NOTE — ED Notes (Signed)
Avanthika complains of fever, chills, sweats, body aches, headaches, congestion and cough for 2 days. She also reports being tired with exertion and shortness of breath.

## 2013-11-07 NOTE — ED Provider Notes (Signed)
CSN: [id number]     Arrival date & time 11/07/13  1843 History   First MD Initiated Contact with Patient 11/07/13 1937     Chief Complaint  Patient presents with  . Fever    x 2 days  . Cough    x 2 days  . Generalized Body Aches    x 2 days      HPI Comments: Patient complains of one day history of fatigue, fever/chills, sweats, myalgias, headaches, sinus congestion and cough  The history is provided by the patient.    Past Medical History  Diagnosis Date  . Depression   . Anxiety   . Allergy    Past Surgical History  Procedure Laterality Date  . Cholecystectomy  08-2009   Family History  Problem Relation Age of Onset  . Depression Mother   . Diabetes Father   . Hypertension Father   . Hyperlipidemia Father   . Depression Brother    History  Substance Use Topics  . Smoking status: Former Smoker    Quit date: 11/14/2007  . Smokeless tobacco: Not on file  . Alcohol Use: Yes     Comment: once every coupld of month.    OB History   Grav Para Term Preterm Abortions TAB SAB Ect Mult Living                 Review of Systems No sore throat + cough No pleuritic pain + wheezing + nasal congestion + post-nasal drainage No sinus pain/pressure No itchy/red eyes ? earache No hemoptysis + SOB + fever, + chills No nausea No vomiting No abdominal pain No diarrhea No urinary symptoms No skin rash + fatigue + myalgias + headache Used OTC meds without relief  Allergies  Latex  Home Medications   Current Outpatient Rx  Name  Route  Sig  Dispense  Refill  . albuterol (PROVENTIL HFA;VENTOLIN HFA) 108 (90 BASE) MCG/ACT inhaler   Inhalation   Inhale 2 puffs into the lungs every 6 (six) hours as needed for wheezing.   1 Inhaler   2   . ibuprofen (ADVIL,MOTRIN) 200 MG tablet   Oral   Take 200 mg by mouth every 6 (six) hours as needed.         . pseudoephedrine-acetaminophen (TYLENOL SINUS) 30-500 MG TABS   Oral   Take 1 tablet by mouth every 4 (four)  hours as needed.         Marland Kitchen azithromycin (ZITHROMAX Z-PAK) 250 MG tablet      Take 2 tabs today; then begin one tab once daily for 4 more days. (Rx void after 10/09/13)   6 each   0   . benzonatate (TESSALON) 200 MG capsule   Oral   Take 1 capsule (200 mg total) by mouth at bedtime. Take as needed for cough   12 capsule   0   . oseltamivir (TAMIFLU) 75 MG capsule   Oral   Take 1 capsule (75 mg total) by mouth every 12 (twelve) hours.   10 capsule   0   . predniSONE (DELTASONE) 20 MG tablet   Oral   Take 1 tablet (20 mg total) by mouth 2 (two) times daily.   10 tablet   0    BP 104/69  Pulse 122  Temp(Src) 100.6 F (38.1 C) (Oral)  Ht 5\' 4"  (1.626 m)  Wt 133 lb (60.328 kg)  BMI 22.82 kg/m2  SpO2 96% Physical Exam Nursing notes and Vital Signs reviewed.  Appearance:  Patient appears healthy, stated age, and in no acute distress Eyes:  Pupils are equal, round, and reactive to light and accomodation.  Extraocular movement is intact.  Conjunctivae are not inflamed  Ears:  Canals normal.  Tympanic membranes normal.  Nose:  Mildly congested turbinates.  No sinus tenderness.    Pharynx:  Normal Neck:  Supple.  No adenopathy Lungs:  Clear to auscultation.  Breath sounds are equal.  Heart:  Regular rate and rhythm without murmurs, rubs, or gallops.  Abdomen:  Nontender without masses or hepatosplenomegaly.  Bowel sounds are present.  No CVA or flank tenderness.  Extremities:  No edema.  No calf tenderness Skin:  No rash present.   ED Course  Procedures  none    MDM   1. Influenza-like illness    Begin Tamiflu.  Prescription written for Benzonatate [facility]) to take at bedtime for night-time cough.  Take Mucinex D (1200mg  guaifenesin with decongestant) twice daily for congestion.  Increase fluid intake, rest. May use Afrin nasal spray (or generic oxymetazoline) twice daily for about 5 days.  Also recommend using saline nasal spray several times daily and saline nasal  irrigation (AYR is a common brand) Try warm salt water gargles for sore throat.  Stop all antihistamines for now, and other non-prescription cough/cold preparations. May take Ibuprofen 200mg , 3 or 4 tabs every 8 hours with food for fever, body aches, headache, etc. Recommend a flu shot when well.  Followup with Family Doctor if not improved in one week.     Lattie Haw, MD 11/12/13 2207

## 2014-01-30 ENCOUNTER — Encounter: Payer: Self-pay | Admitting: Family

## 2014-01-30 ENCOUNTER — Ambulatory Visit (INDEPENDENT_AMBULATORY_CARE_PROVIDER_SITE_OTHER): Payer: BC Managed Care – PPO | Admitting: Family

## 2014-01-30 VITALS — BP 90/64 | HR 84 | Resp 16 | Ht 65.0 in | Wt 134.0 lb

## 2014-01-30 DIAGNOSIS — Z124 Encounter for screening for malignant neoplasm of cervix: Secondary | ICD-10-CM

## 2014-01-30 DIAGNOSIS — Z01419 Encounter for gynecological examination (general) (routine) without abnormal findings: Secondary | ICD-10-CM

## 2014-01-30 DIAGNOSIS — Z113 Encounter for screening for infections with a predominantly sexual mode of transmission: Secondary | ICD-10-CM

## 2014-01-30 NOTE — Progress Notes (Signed)
  Subjective:     [name] is a 23 y.o. female here for a routine exam.  Current complaints: reports intermittent pelvic pain.  There is no pattern to pain, but it occurs at least once a week and primarily in the right lower pelvis.  Concerned because mother, 81 yo recently diagnosed with ovarian mass and is going to undergo a hysterectomy and oophorectomy.    Pt uses Mirena for family planning; using x 4.5 yrs, no cycle since starting.  Personal health questionnaire reviewed: yes.   Gynecologic History No LMP recorded. Patient is not currently having periods (Reason: IUD). Contraception: IUD Last Pap: 2011. Results were: abnormal, ASCUS, however she was under 38 yo   Obstetric History OB History  Gravida Para Term Preterm AB SAB TAB Ectopic Multiple Living  1 1 1       1     # Outcome Date GA Lbr Len/2nd Weight Sex Delivery Anes PTL Lv  1 TRM     F SVD          The following portions of the patient's history were reviewed and updated as appropriate: allergies, current medications, past family history, past medical history, past social history, past surgical history and problem list.  Review of Systems Pertinent items are noted in HPI.    Objective:     BP 90/64  Pulse 84  Resp 16  Ht 5\' 5"  (1.651 m)  Wt 134 lb (60.782 kg)  BMI 22.30 kg/m2 General appearance: alert, cooperative and appears stated age Head: Normocephalic, without obvious abnormality, atraumatic Neck: no adenopathy, no carotid bruit, no JVD, supple, symmetrical, trachea midline and thyroid not enlarged, symmetric, no tenderness/mass/nodules Lungs: clear to auscultation bilaterally Breasts: normal appearance, no masses or tenderness, No nipple retraction or dimpling, No nipple discharge or bleeding, No axillary or supraclavicular adenopathy, Normal to palpation without dominant masses, Taught monthly breast self examination Heart: regular rate and rhythm, S1, S2 normal, no murmur, click, rub or  gallop Abdomen: soft, non-tender; bowel sounds normal; no masses,  no organomegaly Pelvic: cervix normal in appearance, IUD strings in place; external genitalia normal, no adnexal masses; right lower pelvic tenderness, no cervical motion tenderness, rectovaginal septum normal, uterus normal size, shape, and consistency and vagina normal without discharge Skin: Skin color, texture, turgor normal. No rashes or lesions       Assessment:     Well Woman Exam Pelvic Pain .    Plan:    Pap smear to lab; declines GC or CT screening.   Pelvic ultrasound Follow-up in 4-6 wks.  [facility]

## 2014-02-02 ENCOUNTER — Other Ambulatory Visit: Payer: BC Managed Care – PPO

## 2014-02-12 ENCOUNTER — Other Ambulatory Visit: Payer: BC Managed Care – PPO

## 2014-03-12 ENCOUNTER — Encounter: Payer: Self-pay | Admitting: Family Medicine

## 2014-03-12 ENCOUNTER — Ambulatory Visit (INDEPENDENT_AMBULATORY_CARE_PROVIDER_SITE_OTHER): Payer: BC Managed Care – PPO | Admitting: Family Medicine

## 2014-03-12 VITALS — BP 118/76 | HR 67 | Ht 64.75 in | Wt 134.0 lb

## 2014-03-12 DIAGNOSIS — Z0000 Encounter for general adult medical examination without abnormal findings: Secondary | ICD-10-CM

## 2014-03-12 DIAGNOSIS — R142 Eructation: Secondary | ICD-10-CM

## 2014-03-12 DIAGNOSIS — R143 Flatulence: Secondary | ICD-10-CM

## 2014-03-12 DIAGNOSIS — R140 Abdominal distension (gaseous): Secondary | ICD-10-CM

## 2014-03-12 DIAGNOSIS — I959 Hypotension, unspecified: Secondary | ICD-10-CM

## 2014-03-12 DIAGNOSIS — R141 Gas pain: Secondary | ICD-10-CM

## 2014-03-12 DIAGNOSIS — R1084 Generalized abdominal pain: Secondary | ICD-10-CM

## 2014-03-12 LAB — COMPLETE METABOLIC PANEL WITH GFR
ALBUMIN: 4.6 g/dL (ref 3.5–5.2)
ALT: 12 U/L (ref 0–35)
AST: 17 U/L (ref 0–37)
Alkaline Phosphatase: 53 U/L (ref 39–117)
BUN: 7 mg/dL (ref 6–23)
CALCIUM: 9.9 mg/dL (ref 8.4–10.5)
CHLORIDE: 103 meq/L (ref 96–112)
CO2: 29 meq/L (ref 19–32)
Creat: 0.61 mg/dL (ref 0.50–1.10)
GFR, Est African American: >89 mL/min
GLUCOSE: 79 mg/dL (ref 70–99)
POTASSIUM: 4.3 meq/L (ref 3.5–5.3)
SODIUM: 139 meq/L (ref 135–145)
TOTAL PROTEIN: 7.5 g/dL (ref 6.0–8.3)
Total Bilirubin: 0.9 mg/dL (ref 0.2–1.2)

## 2014-03-12 LAB — CBC WITH DIFFERENTIAL/PLATELET
BASOS ABS: 0 10*3/uL (ref 0.0–0.1)
BASOS PCT: 0 % (ref 0–1)
EOS PCT: 2 % (ref 0–5)
Eosinophils Absolute: 0.1 10*3/uL (ref 0.0–0.7)
HEMATOCRIT: 40.8 % (ref 36.0–46.0)
Hemoglobin: 14.2 g/dL (ref 12.0–15.0)
LYMPHS PCT: 35 % (ref 12–46)
Lymphs Abs: 2.2 10*3/uL (ref 0.7–4.0)
MCH: 29.8 pg (ref 26.0–34.0)
MCHC: 34.8 g/dL (ref 30.0–36.0)
MCV: 85.5 fL (ref 78.0–100.0)
MONO ABS: 0.4 10*3/uL (ref 0.1–1.0)
Monocytes Relative: 6 % (ref 3–12)
NEUTROS ABS: 3.6 10*3/uL (ref 1.7–7.7)
Neutrophils Relative %: 57 % (ref 43–77)
PLATELETS: 235 10*3/uL (ref 150–400)
RBC: 4.77 MIL/uL (ref 3.87–5.11)
RDW: 12.9 % (ref 11.5–15.5)
WBC: 6.3 10*3/uL (ref 4.0–10.5)

## 2014-03-12 LAB — LIPID PANEL
CHOLESTEROL: 125 mg/dL (ref 0–200)
HDL: 57 mg/dL (ref >39)
LDL Cholesterol: 59 mg/dL (ref 0–99)
Total CHOL/HDL Ratio: 2.2 Ratio
Triglycerides: 44 mg/dL (ref <150)
VLDL: 9 mg/dL (ref 0–40)

## 2014-03-12 LAB — TSH: TSH: 0.642 u[IU]/mL (ref 0.350–4.500)

## 2014-03-12 NOTE — Progress Notes (Signed)
Subjective:     [name] is a 23 y.o. female and is here for a comprehensive physical exam. The patient reports problems - Bp has been running low for a couple of months. did drink some coffee some this AM. Says has really low energy when her BP is low.  Was on zoloft at one point and says it feels like that. Comes and goes.  Has been trying ot increase her caffeine.  Says her stomach swells after eating.  Says BMs are mostly normal.  somestimes feels pain with the bloating.    History   Social History  . Marital Status: Single    Spouse Name: N/A    Number of Children: 1  . Years of Education: N/A   Occupational History  . 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 Smoking status: Former Smoker    Quit date: 11/14/2007  . Smokeless tobacco: Never Used  . Alcohol Use: Yes     Comment: once every coupld of month.   . Drug Use: No  . Sexual Activity: Yes    Partners: Male     Comment:  HS diploma, 5 caffeine drinks daily,no regular exercise.   Other Topics Concern  . Not on file   Social History Narrative   Exercise 5-6 days per week.    Health Maintenance  Topic Date Due  . Influenza Vaccine  06/13/2014  . Pap Smear  01/30/2017  . Tetanus/tdap  02/15/2022    The following portions of the patient's history were reviewed and updated as appropriate: allergies, current medications, past family history, past medical history, past social history, past surgical history and problem list.  Review of Systems A comprehensive review of systems was negative.   Objective:    BP 118/76  Pulse 67  Ht 5' 4.75" (1.645 m)  Wt 134 lb (60.782 kg)  BMI 22.46 kg/m2 General appearance: alert, cooperative and appears stated age Head: Normocephalic, without obvious abnormality, atraumatic Eyes: conj clear, EOMi PEERLA Ears: normal TM's and external ear canals both ears Nose: Nares normal. Septum midline. Mucosa normal. No drainage or sinus  tenderness. Throat: lips, mucosa, and tongue normal; teeth and gums normal Neck: no adenopathy, no carotid bruit, no JVD, supple, symmetrical, trachea midline and thyroid not enlarged, symmetric, no tenderness/mass/nodules Back: symmetric, no curvature. ROM normal. No CVA tenderness. Lungs: clear to auscultation bilaterally Breasts: normal appearance, no masses or tenderness Heart: regular rate and rhythm, S1, S2 normal, no murmur, click, rub or gallop Abdomen: soft, non-tender; bowel sounds normal; no masses,  no organomegaly Extremities: extremities normal, atraumatic, no cyanosis or edema Pulses: 2+ and symmetric Skin: Skin color, texture, turgor normal. No rashes or lesions Lymph nodes: Cervical, supraclavicular, and axillary nodes normal. Neurologic: Alert and oriented X 3, normal strength and tone. Normal symmetric reflexes. Normal coordination and gait    Assessment:    Healthy female exam.    Plan:     See After Visit Summary for Counseling Recommendations  Keep up a regular exercise program and make sure you are eating a healthy diet Try to eat 4 servings of dairy a day, or if you are lactose intolerant take a calcium with vitamin D daily.  Your vaccines are up to date.  CMp and lipids.    Abdominal bloating/Gen., with discomfort-discussed this could be several different causes. Recommend that she make sure her bowels are moving regularly with a stool soft or even  MiraLax if needed. She says they are somewhat irregular. Also recommend a trial of over-the-counter probiotic to help balance gut flora.  Also try to track symptoms and see if there any triggers such as dairy products or possibly bleeding. Also consider a dairy or gluten free diet trial for 3-4 weeks to see if it makes a difference as well. If she still expecting discomfort after trying this then recommend referral to GI for further evaluation Pap smear is up-to-date.  She also reports hypotension-makes her feel tired  and fatigued. Will check a CBC with differential as well as a TSH. Her blood pressure actually looks great today. Okay to liberalize salt in her diet but not to excess. Make sure hydrating very well during the daytime and not skipping meals.Marland Kitchen

## 2014-03-12 NOTE — Patient Instructions (Signed)
Keep up a regular exercise program and make sure you are eating a healthy diet Try to eat 4 servings of dairy a day, or if you are lactose intolerant take a calcium with vitamin D daily.  Your vaccines are up to date.   

## 2014-03-13 LAB — GLIA (IGA/G) + TTG IGA
Gliadin IgA: 3.7 U/mL (ref <20)
Gliadin IgG: 5.5 U/mL (ref <20)
TISSUE TRANSGLUTAMINASE AB, IGA: 3.2 U/mL (ref <20)

## 2014-03-13 LAB — ALLERGEN MILK: Milk IgE: <0.1 kU/L

## 2014-05-28 ENCOUNTER — Ambulatory Visit: Payer: BC Managed Care – PPO | Admitting: Physician Assistant

## 2014-05-28 DIAGNOSIS — Z0289 Encounter for other administrative examinations: Secondary | ICD-10-CM

## 2014-05-29 ENCOUNTER — Telehealth: Payer: Self-pay | Admitting: Physician Assistant

## 2014-05-29 NOTE — Telephone Encounter (Signed)
Missed appt yesterday. If continues to have pain and discomfort from IUd removal needs to happen. Schedule appt with OB or our office for removal.

## 2014-06-04 ENCOUNTER — Encounter: Payer: Self-pay | Admitting: Obstetrics & Gynecology

## 2014-06-04 ENCOUNTER — Ambulatory Visit (INDEPENDENT_AMBULATORY_CARE_PROVIDER_SITE_OTHER): Payer: BC Managed Care – PPO | Admitting: Obstetrics & Gynecology

## 2014-06-04 VITALS — BP 119/83 | HR 101 | Resp 16 | Ht 65.0 in | Wt 133.0 lb

## 2014-06-04 DIAGNOSIS — N949 Unspecified condition associated with female genital organs and menstrual cycle: Secondary | ICD-10-CM | POA: Diagnosis not present

## 2014-06-04 DIAGNOSIS — N938 Other specified abnormal uterine and vaginal bleeding: Secondary | ICD-10-CM

## 2014-06-04 DIAGNOSIS — Z30432 Encounter for removal of intrauterine contraceptive device: Secondary | ICD-10-CM

## 2014-06-04 DIAGNOSIS — Z3009 Encounter for other general counseling and advice on contraception: Secondary | ICD-10-CM

## 2014-06-04 DIAGNOSIS — Z01812 Encounter for preprocedural laboratory examination: Secondary | ICD-10-CM | POA: Diagnosis not present

## 2014-06-04 DIAGNOSIS — N925 Other specified irregular menstruation: Secondary | ICD-10-CM | POA: Diagnosis not present

## 2014-06-04 LAB — POCT URINE PREGNANCY: Preg Test, Ur: NEGATIVE

## 2014-06-04 MED ORDER — MEDROXYPROGESTERONE ACETATE 150 MG/ML IM SUSP
150.0000 mg | Freq: Once | INTRAMUSCULAR | Status: AC
  Administered 2014-06-04: 150 mg via INTRAMUSCULAR

## 2014-06-04 NOTE — Progress Notes (Signed)
   Subjective:    Patient ID: [name], female    DOB: [birth year], 23 y.o.   MRN: [id number]  HPI  This SW GP1 is here because she started with DUB. Her Mirena is due to expire in 2 months. Prior to this she has been essentially amenorrheic. She has had a new partner over the last year. She is not sure that she wants a pregnancy with him at this time. She would like to use depo provera for contraception. I offered to give her the depo today and remove the Mirena next week, but she would like it out today.  Review of Systems     Objective:   Physical Exam Her vulva/vagina are normal. The intact Mirena was easily removed. She tolerated the procedure well.       Assessment & Plan:  DUB- probably related to Mirena, but I will check cervical cultures

## 2014-06-05 LAB — GC/CHLAMYDIA PROBE AMP
CT PROBE, AMP APTIMA: NEGATIVE
GC PROBE AMP APTIMA: NEGATIVE

## 2014-06-11 NOTE — Telephone Encounter (Signed)
LMOM for pt to return call. 

## 2014-08-27 ENCOUNTER — Ambulatory Visit (INDEPENDENT_AMBULATORY_CARE_PROVIDER_SITE_OTHER): Payer: BC Managed Care – PPO | Admitting: *Deleted

## 2014-08-27 VITALS — BP 118/72 | HR 72 | Resp 16 | Ht 65.0 in | Wt 133.0 lb

## 2014-08-27 DIAGNOSIS — Z3042 Encounter for surveillance of injectable contraceptive: Secondary | ICD-10-CM

## 2014-08-27 MED ORDER — MEDROXYPROGESTERONE ACETATE 150 MG/ML IM SUSP
150.0000 mg | INTRAMUSCULAR | Status: DC
  Administered 2014-08-27: 150 mg via INTRAMUSCULAR

## 2014-09-14 ENCOUNTER — Encounter: Payer: Self-pay | Admitting: Obstetrics & Gynecology

## 2014-09-18 ENCOUNTER — Encounter: Payer: Self-pay | Admitting: Family Medicine

## 2014-09-18 ENCOUNTER — Ambulatory Visit (INDEPENDENT_AMBULATORY_CARE_PROVIDER_SITE_OTHER): Payer: BC Managed Care – PPO | Admitting: Family Medicine

## 2014-09-18 VITALS — BP 119/75 | HR 88 | Ht 65.0 in | Wt 127.0 lb

## 2014-09-18 DIAGNOSIS — R3989 Other symptoms and signs involving the genitourinary system: Secondary | ICD-10-CM | POA: Diagnosis not present

## 2014-09-18 DIAGNOSIS — R112 Nausea with vomiting, unspecified: Secondary | ICD-10-CM

## 2014-09-18 DIAGNOSIS — R809 Proteinuria, unspecified: Secondary | ICD-10-CM | POA: Diagnosis not present

## 2014-09-18 DIAGNOSIS — F411 Generalized anxiety disorder: Secondary | ICD-10-CM | POA: Diagnosis not present

## 2014-09-18 LAB — POCT URINALYSIS DIPSTICK
Blood, UA: NEGATIVE
Glucose, UA: NEGATIVE
LEUKOCYTES UA: NEGATIVE
NITRITE UA: NEGATIVE
PH UA: 5.5
Spec Grav, UA: 1.025
UROBILINOGEN UA: 0.2

## 2014-09-18 LAB — POCT URINE PREGNANCY: PREG TEST UR: NEGATIVE

## 2014-09-18 MED ORDER — ONDANSETRON 4 MG PO TBDP: 4.0000 mg | ORAL_TABLET | Freq: Three times a day (TID) | ORAL | Status: DC | PRN

## 2014-09-18 MED ORDER — FLUOXETINE HCL 10 MG PO CAPS: ORAL_CAPSULE | ORAL | Status: DC

## 2014-09-18 MED ORDER — PANTOPRAZOLE SODIUM 40 MG PO TBEC: 40.0000 mg | DELAYED_RELEASE_TABLET | Freq: Every day | ORAL | Status: DC

## 2014-09-18 NOTE — Progress Notes (Signed)
Subjective:    Patient ID: [name], female    DOB: 1991/08/23, 23 y.o.   MRN: [id number]  HPI 23 year old female with a prior history of anxiety. She said when she was in her preteens she suffered from depression and anxiety and had GI symptoms including vomiting with her mood disorder at that time. She did do therapy and says it took about 4 years for her symptoms to really resolve. She was on and off medications during that time frame. She says overall she did well until her pregnancy and then she suffered from depression after giving birth. At that time she did try sertraline but they kept her on a very low dose and eventually she quit taking it because it was ineffective. She had been on it once previously in her life. She really doesn't answer any the other medications that she had tried.  Has been nauseated for the last 6 months.  She had mentioned that at her physical in April. At that time I had recommended a stool softener because she was not having consistently soft bowel movements she was having to strain. Had also recommended a probiotic because she was complaining of significant bloating. She did try the probiotic but did not use the MiraLAX.  Now vomiting everal times a day for the last 4-5 days.  Hasn't really eaten muchat all in the last several days.  Occ will have yellow mucous BM.  Has tried benzos in the past and says really didn't work for her.  He denies any specific triggers as far as increased stress levels etc.no fevers chills or sweats. No blood in the stool. Review of Systems     BP 119/75 mmHg  Pulse 88  Ht 5\' 5"  (1.651 m)  Wt 127 lb (57.607 kg)  BMI 21.13 kg/m2    Allergies  Allergen Reactions  . Latex     Past Medical History  Diagnosis Date  . Depression   . Anxiety   . Allergy     Past Surgical History  Procedure Laterality Date  . Cholecystectomy  08-2009    History   Social History  . Marital Status: Single    Spouse Name: N/A   Number of Children: 1  . Years of Education: N/A   Occupational History  . 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 Smoking status: Former Smoker    Quit date: 11/14/2007  . Smokeless tobacco: Never Used  . Alcohol Use: Yes     Comment: once every coupld of month.   . Drug Use: No  . Sexual Activity:    Partners: Male     Comment:  HS diploma, 5 caffeine drinks daily,no regular exercise.   Other Topics Concern  . Not on file   Social History Narrative   Exercise 5-6 days per week. Occ caffeine.     Family History  Problem Relation Age of Onset  . Depression Mother   . Diabetes Father   . Hypertension Father   . Hyperlipidemia Father   . Depression Brother   . Colon cancer Maternal Grandfather   . Prostate cancer    . Melanoma      Outpatient Encounter Prescriptions as of 09/18/2014  Medication Sig  . AMBULATORY NON FORMULARY MEDICATION daily. Triple allergy defense  . FLUoxetine (PROZAC) 10 MG capsule 1 a day x 5 days, then 2 a day for 10 days, then 3 a day.  13/04/2014  ondansetron (ZOFRAN ODT) 4 MG disintegrating tablet Take 1 tablet (4 mg total) by mouth every 8 (eight) hours as needed for nausea or vomiting.  . pantoprazole (PROTONIX) 40 MG tablet Take 1 tablet (40 mg total) by mouth daily.  . [DISCONTINUED] albuterol (PROVENTIL HFA;VENTOLIN HFA) 108 (90 BASE) MCG/ACT inhaler Inhale 2 puffs into the lungs every 6 (six) hours as needed for wheezing.  . [DISCONTINUED] cetirizine (ZYRTEC) 10 MG tablet Take 10 mg by mouth.  . [DISCONTINUED] ibuprofen (ADVIL,MOTRIN) 200 MG tablet Take 200 mg by mouth every 6 (six) hours as needed.  . [DISCONTINUED] levonorgestrel (MIRENA) 20 MCG/24HR IUD 1 each by Intrauterine route once.       Objective:   Physical Exam  Constitutional: She is oriented to person, place, and time. She appears well-developed and well-nourished.  HENT:  Head: Normocephalic and atraumatic.  Cardiovascular: Normal rate, regular  rhythm and normal heart sounds.   Pulmonary/Chest: Effort normal and breath sounds normal.  Abdominal: Bowel sounds are normal. She exhibits no distension and no mass. There is no tenderness. There is no rebound and no guarding.  Neurological: She is alert and oriented to person, place, and time.  Skin: Skin is warm and dry.  Psychiatric: She has a normal mood and affect. Her behavior is normal.          Assessment & Plan:  GAD- GAD- 7 score of 21.  Uncontrolled. Discussed options of medication and therapy. We will start her on fluoxetine 10 mg then taper over the next few weeks. I'll see her back in 3 weeks to make sure that she's doing well the medication. We discussed potential side effects. She can surly call sooner if she's having concerns and we cannot make a change over the phone if needed. Otherwise I will see her back in 3 weeks. Offered therapy but she declined at this time. She felt like it really wasn't that helpful in the past.  Vomiting, cyclic-most likely related to her mood disorder. The she has had persistent nausea for several months. I recommend a trial of a proton pump inhibitor as well as nausea medication as needed. I really think this is related to her mood so we will focus on that as well. If she's not improving or still continuing to vomit and not be able to keep food down then recommend referral to GI for further evaluation and treatment.  Time spent 35 minutes, greater than 50% the time spent counseling about anxiety and vomiting.

## 2014-09-19 LAB — TSH: TSH: 0.706 u[IU]/mL (ref 0.350–4.500)

## 2014-09-20 LAB — URINE CULTURE
Colony Count: NO GROWTH
Organism ID, Bacteria: NO GROWTH

## 2014-09-22 ENCOUNTER — Other Ambulatory Visit: Payer: Self-pay | Admitting: Family Medicine

## 2014-09-22 ENCOUNTER — Telehealth: Payer: Self-pay

## 2014-09-22 MED ORDER — ALPRAZOLAM 0.25 MG PO TABS: 0.2500 mg | ORAL_TABLET | Freq: Every day | ORAL | Status: DC | PRN

## 2014-09-22 NOTE — Telephone Encounter (Signed)
Unable to leave a message.

## 2014-09-22 NOTE — Telephone Encounter (Signed)
We faxed over the xanax so she can try it tomorrow. Can take 2 tabs if needs to.  Ok for work note for today and tomorrow if needs one.  Keep taking the fluoxetine as it will take 2-3 weeks to start working. Call us tomorrow after trying the xanax and let us know if helping some.

## 2014-09-22 NOTE — Telephone Encounter (Signed)
[name] states she is not feeling any better. She had three anxiety attacks yesterday. She still has the nausea and waking feeling very scared. Some mornings she vomits. She denies any major changes in her life or recent trama. She called in sick today because she feels so scared of having more panic attacks.

## 2014-09-25 NOTE — Telephone Encounter (Signed)
Unable to leave a message.

## 2014-10-01 ENCOUNTER — Telehealth: Payer: Self-pay | Admitting: *Deleted

## 2014-10-01 NOTE — Telephone Encounter (Signed)
Pt called and reports that she was seen by GI on Friday and was told that she has Gastroparesis. She is still taking protonix and they added an additional med. She has an appt next week to f/u with Dr. Linford Arnold. She just wanted to make her aware.Loralee Pacas [place]

## 2014-10-01 NOTE — Telephone Encounter (Signed)
Pt called back and stated that she has been vomiting clear thick mucus, she said when she takes really deep breaths it sounds "crackly" she also said that she has had some SOB and a fever of 99.6 this morning. Appt made for tomorrow at 815.Loralee Pacas [place]

## 2014-10-02 ENCOUNTER — Ambulatory Visit: Payer: BC Managed Care – PPO | Admitting: Family Medicine

## 2014-10-07 ENCOUNTER — Encounter: Payer: Self-pay | Admitting: Family Medicine

## 2014-10-07 ENCOUNTER — Ambulatory Visit (INDEPENDENT_AMBULATORY_CARE_PROVIDER_SITE_OTHER): Payer: BC Managed Care – PPO | Admitting: Family Medicine

## 2014-10-07 VITALS — BP 105/73 | HR 75 | Temp 97.9°F | Ht 65.0 in | Wt 126.0 lb

## 2014-10-07 DIAGNOSIS — F411 Generalized anxiety disorder: Secondary | ICD-10-CM | POA: Diagnosis not present

## 2014-10-07 MED ORDER — ALPRAZOLAM 0.5 MG PO TABS: 0.5000 mg | ORAL_TABLET | Freq: Every day | ORAL | Status: DC | PRN

## 2014-10-07 NOTE — Progress Notes (Signed)
   Subjective:    Patient ID: [name], female    DOB: May 10, 1991, 23 y.o.   MRN: [id number]  HPI Anxiety -  Last seen 3 weeks ago. I started her on fluoxetine she had a significant increase in her anxiety level. Her GAD 7 score was 21 at that time. She said the medication about 3 days ago because she felt like it was ineffective. She feels like her anxiety has calmed down significantly. At this point in time she would really like to not be on a daily medication. She did use some of the Xanax that I gave herself but that she usually had to take 2 to get an effect. As far as her nausea and vomiting that has calmed down as well. She was not able to tolerate the medications that the G.I. Dr. Jovita Gamma her so she is now using more natural remedies and says it seems to be working well. She plans on keeping her point with G.I. Which is scheduled for next week.  Review of Systems     Objective:   Physical Exam  Constitutional: She is oriented to person, place, and time. She appears well-developed and well-nourished.  HENT:  Head: Normocephalic and atraumatic.  Right Ear: External ear normal.  Left Ear: External ear normal.  Nose: Nose normal.  Mouth/Throat: Oropharynx is clear and moist.  TMs and canals are clear.   Eyes: Conjunctivae and EOM are normal. Pupils are equal, round, and reactive to light.  Neck: Neck supple. No thyromegaly present.  Cardiovascular: Normal rate, regular rhythm and normal heart sounds.   Pulmonary/Chest: Effort normal and breath sounds normal. She has no wheezes.  Lymphadenopathy:    She has no cervical adenopathy.  Neurological: She is alert and oriented to person, place, and time.  Skin: Skin is warm and dry.  Psychiatric: She has a normal mood and affect.          Assessment & Plan:  Anxiety - GAD- 7 score of 5 today. Previous was 21.   Improved. She desires to stay off of an SSRI at this point in time. I did discuss with her in length that she really did  not take it long enough or allow [place] to adjust your dose to see if it was going to be therapeutic for her. I did go ahead and refill her Xanax today at 0.5 mg but discussed with her that this should actually last are several months. And if she's using it more frequently than she needs to come back in and discuss getting back on a controller. Patient agrees and says she will follow-up in 60 days.

## 2014-10-17 ENCOUNTER — Emergency Department
Admission: EM | Admit: 2014-10-17 | Discharge: 2014-10-17 | Disposition: A | Discharge status: Home / Self Care | Payer: BC Managed Care – PPO | Source: Home / Self Care | Attending: Family Medicine | Admitting: Family Medicine

## 2014-10-17 ENCOUNTER — Encounter: Payer: Self-pay | Admitting: *Deleted

## 2014-10-17 ENCOUNTER — Emergency Department (INDEPENDENT_AMBULATORY_CARE_PROVIDER_SITE_OTHER): Payer: BC Managed Care – PPO

## 2014-10-17 DIAGNOSIS — S6992XA Unspecified injury of left wrist, hand and finger(s), initial encounter: Secondary | ICD-10-CM

## 2014-10-17 DIAGNOSIS — S60222A Contusion of left hand, initial encounter: Secondary | ICD-10-CM

## 2014-10-17 DIAGNOSIS — M79642 Pain in left hand: Secondary | ICD-10-CM

## 2014-10-17 NOTE — ED Notes (Signed)
Pt fell 5 days ago on her L hand.  Pain was not bad and it bruised but pain has gotten worse and bruising has spread so she cam to be seen.  Had some numbness in hand occasionally and the pain has started to go down her arm.  Denies decreased movement. Pain 2/10

## 2014-10-17 NOTE — Discharge Instructions (Signed)
Apply ice pack for 10 to 15 minutes, 2 or 3 times daily  Continue until pain decreases.  May take Ibuprofen 200mg , 4 tabs every 8 hours with food.    Contusion A contusion is a deep bruise. Contusions are the result of an injury that caused bleeding under the skin. The contusion may turn blue, purple, or yellow. Minor injuries will give you a painless contusion, but more severe contusions may stay painful and swollen for a few weeks.  CAUSES  A contusion is usually caused by a blow, trauma, or direct force to an area of the body. SYMPTOMS   Swelling and redness of the injured area.  Bruising of the injured area.  Tenderness and soreness of the injured area.  Pain. DIAGNOSIS  The diagnosis can be made by taking a history and physical exam. An X-ray, CT scan, or MRI may be needed to determine if there were any associated injuries, such as fractures. TREATMENT  Specific treatment will depend on what area of the body was injured. In general, the best treatment for a contusion is resting, icing, elevating, and applying cold compresses to the injured area. Over-the-counter medicines may also be recommended for pain control. Ask your caregiver what the best treatment is for your contusion. HOME CARE INSTRUCTIONS   Put ice on the injured area.  Put ice in a plastic bag.  Place a towel between your skin and the bag.  Leave the ice on for 15-20 minutes, 3-4 times a day, or as directed by your health care provider.  Only take over-the-counter or prescription medicines for pain, discomfort, or fever as directed by your caregiver. Your caregiver may recommend avoiding anti-inflammatory medicines (aspirin, ibuprofen, and naproxen) for 48 hours because these medicines may increase bruising.  Rest the injured area.  If possible, elevate the injured area to reduce swelling. SEEK IMMEDIATE MEDICAL CARE IF:   You have increased bruising or swelling.  You have pain that is getting worse.  Your  swelling or pain is not relieved with medicines. MAKE SURE YOU:   Understand these instructions.  Will watch your condition.  Will get help right away if you are not doing well or get worse. Document Released: 08/09/2005 Document Revised: 11/04/2013 Document Reviewed: 09/04/2011 [facility] Patient Information 2015 [place], [place]. This information is not intended to replace advice given to you by your health care provider. Make sure you discuss any questions you have with your health care provider.

## 2014-10-17 NOTE — ED Provider Notes (Signed)
CSN: [id number]     Arrival date & time 10/17/14  1422 History   First MD Initiated Contact with Patient 10/17/14 1442     Chief Complaint  Patient presents with  . Hand Injury      HPI Comments: Patient fell on her left hand five days ago.  She developed mild bruising but minimal swelling.  She has had some intermittent numbness in her hand, and has developed increased pain radiating to her wrist.  Patient is a 23 y.o. female presenting with hand injury. The history is provided by the patient.  Hand Injury Location:  Hand Time since incident:  5 days Injury: yes   Mechanism of injury: fall   Fall:    Point of impact: left hand. Hand location:  L hand Pain details:    Quality:  Aching   Radiates to:  L arm and L wrist   Severity:  Mild   Onset quality:  Sudden   Duration:  5 days   Timing:  Intermittent   Progression:  Improving Chronicity:  New Prior injury to area:  No Relieved by:  None tried Worsened by:  Movement Ineffective treatments:  None tried Associated symptoms: numbness and stiffness   Associated symptoms: no decreased range of motion, no muscle weakness, no swelling and no tingling     Past Medical History  Diagnosis Date  . Depression   . Anxiety   . Allergy    Past Surgical History  Procedure Laterality Date  . Cholecystectomy  08-2009   Family History  Problem Relation Age of Onset  . Depression Mother   . Diabetes Father   . Hypertension Father   . Hyperlipidemia Father   . Depression Brother   . Colon cancer Maternal Grandfather   . Prostate cancer    . Melanoma     History  Substance Use Topics  . Smoking status: Former Smoker    Quit date: 11/14/2007  . Smokeless tobacco: Never Used  . Alcohol Use: Yes     Comment: once every coupld of month.    OB History    Gravida Para Term Preterm AB TAB SAB Ectopic Multiple Living   1 1 1       1      Review of Systems  Musculoskeletal: Positive for stiffness.  All other systems reviewed  and are negative.   Allergies  Latex  Home Medications   Prior to Admission medications   Medication Sig Start Date End Date Taking? Authorizing Provider  albuterol (PROVENTIL HFA;VENTOLIN HFA) 108 (90 BASE) MCG/ACT inhaler Inhale into the lungs every 6 (six) hours as needed for wheezing or shortness of breath.    Historical Provider, MD  ALOE PO Take by mouth.    Historical Provider, MD  Alpha-D-Galactosidase (ECK FOOD ENZYME PO) Take by mouth.    Historical Provider, MD  ALPRAZolam ) 0.5 MG tablet Take 1 tablet (0.5 mg total) by mouth daily as needed for anxiety. 10/07/14   10/09/14, MD  AMBULATORY NON FORMULARY MEDICATION daily. Triple allergy defense    Historical Provider, MD  Probiotic Product (PROBIOTIC DAILY PO) Take by mouth.    Historical Provider, MD   BP 105/72 mmHg  Pulse 97  Temp(Src) 98.7 F (37.1 C) (Oral)  Ht 5\' 5"  (1.651 m)  Wt 128 lb 8 oz (58.287 kg)  BMI 21.38 kg/m2  SpO2 100% Physical Exam  Constitutional: She is oriented to person, place, and time. She appears well-developed and well-nourished. No distress.  HENT:  Head: Atraumatic.  Eyes: Pupils are equal, round, and reactive to light.  Musculoskeletal:       Left hand: She exhibits tenderness and bony tenderness. She exhibits normal range of motion, normal two-point discrimination, normal capillary refill, no deformity, no laceration and no swelling. Normal sensation noted. Normal strength noted.       Hands: Tenderness dorsally over first MCP joint as noted on diagram.  No ecchymosis present.    Neurological: She is alert and oriented to person, place, and time.  Skin: Skin is warm and dry. No rash noted.  Nursing note and vitals reviewed.   ED Course  Procedures  none    Imaging Review Dg Hand Complete Left  10/17/2014   CLINICAL DATA:  Hand injury, left, initial encounter. The provided history indicates that there is tenderness over the dorsal aspect of the first metatarsal,  fall 5 days ago, although it is the hand that is imaged, not the foot. Presumably this is an inadvertent typographical error and the history may have intended to specify first metacarpal tenderness.  EXAM: LEFT HAND - COMPLETE 3+ VIEW  COMPARISON:  None.  FINDINGS: There is no evidence of fracture or dislocation. There is no evidence of arthropathy or other focal bone abnormality. Soft tissues are unremarkable.  IMPRESSION: Negative. Given the possible discrepancy in clinical history and the body part imaged, if it was intended that the foot be imaged, then dedicated foot radiographs could be ordered and obtained separately.   Electronically Signed   By: Christiana Pellant M.D.   On: 10/17/2014 15:27     MDM   1. Contusion of left hand, initial encounter   2. Hand injury, left, initial encounter        Apply ice pack for 10 to 15 minutes, 2 or 3 times daily  Continue until pain decreases.  May take Ibuprofen 200mg , 4 tabs every 8 hours with food.  Followup with Dr. ([facility]) if not improving about two weeks.     Rodney Langton, MD 10/20/14 [phone]

## 2014-11-07 ENCOUNTER — Other Ambulatory Visit: Payer: Self-pay | Admitting: Family Medicine

## 2014-12-07 ENCOUNTER — Ambulatory Visit: Payer: BC Managed Care – PPO | Admitting: Family Medicine

## 2014-12-07 DIAGNOSIS — Z0289 Encounter for other administrative examinations: Secondary | ICD-10-CM

## 2015-02-18 ENCOUNTER — Ambulatory Visit (INDEPENDENT_AMBULATORY_CARE_PROVIDER_SITE_OTHER): Payer: BLUE CROSS/BLUE SHIELD | Admitting: Sports Medicine

## 2015-02-18 ENCOUNTER — Encounter: Payer: Self-pay | Admitting: Sports Medicine

## 2015-02-18 VITALS — BP 110/74 | HR 110 | Ht 64.0 in | Wt 136.0 lb

## 2015-02-18 DIAGNOSIS — J020 Streptococcal pharyngitis: Secondary | ICD-10-CM | POA: Diagnosis not present

## 2015-02-18 MED ORDER — IBUPROFEN-FAMOTIDINE 800-26.6 MG PO TABS: 1.0000 | ORAL_TABLET | Freq: Three times a day (TID) | ORAL | Status: DC

## 2015-02-18 MED ORDER — PENICILLIN G BENZATHINE 1200000 UNIT/2ML IM SUSP
1.2000 10*6.[IU] | Freq: Once | INTRAMUSCULAR | Status: AC
  Administered 2015-02-18: 1.2 10*6.[IU] via INTRAMUSCULAR

## 2015-02-18 NOTE — Assessment & Plan Note (Signed)
Score of 4 on the Centor system, daughter recently had strep. We will treat empirically considering high score without testing. Benzathine penicillin 1.2 million units intramuscular. Ibuprofen for pain, return if no better in a week.

## 2015-02-18 NOTE — Progress Notes (Signed)
  Subjective:    CC: sore throat and left ear pain  HPI: For the past several days this pleasant 24 year old female has been taking care of her daughter who has diagnosed streptococcal pharyngitis. Unfortunately she is also developed sore throat, fatigue, muscle aches and body aches without nausea, vomiting, diarrhea. Her pain does radiate to the left ear, and she has had fevers at home. She has no cough.  Past medical history, Surgical history, Family history not pertinant except as noted below, Social history, Allergies, and medications have been entered into the medical record, reviewed, and no changes needed.   Review of Systems: No fevers, chills, night sweats, weight loss, chest pain, or shortness of breath.   Objective:    General: Well Developed, well nourished, and in no acute distress.  Neuro: Alert and oriented x3, extra-ocular muscles intact, sensation grossly intact.  HEENT: Normocephalic, atraumatic, pupils equal round reactive to light, neck supple, no masses, tender cervical lymphadenopathy, thyroid nonpalpable. Oropharynx shows an erythematous tonsil on the right with exudates. Nasopharynx and ear canals are unremarkable. Skin: Warm and dry, no rashes. Cardiac: Regular rate and rhythm, no murmurs rubs or gallops, no lower extremity edema.  Respiratory: Clear to auscultation bilaterally. Not using accessory muscles, speaking in full sentences.  Centor score equals 4, presumed streptococcal pharyngitis, 1.2 million units of penicillin given intramuscular.  Impression and Recommendations:

## 2015-02-18 NOTE — Patient Instructions (Signed)

## 2015-02-20 ENCOUNTER — Emergency Department
Admission: EM | Admit: 2015-02-20 | Discharge: 2015-02-20 | Disposition: A | Discharge status: Home / Self Care | Payer: BLUE CROSS/BLUE SHIELD | Source: Home / Self Care

## 2015-02-20 ENCOUNTER — Encounter: Payer: Self-pay | Admitting: *Deleted

## 2015-02-20 DIAGNOSIS — J039 Acute tonsillitis, unspecified: Secondary | ICD-10-CM

## 2015-02-20 MED ORDER — CEPHALEXIN 500 MG PO CAPS: 500.0000 mg | ORAL_CAPSULE | Freq: Three times a day (TID) | ORAL | Status: DC

## 2015-02-20 MED ORDER — CEFTRIAXONE SODIUM 1 G IJ SOLR
1.0000 g | INTRAMUSCULAR | Status: AC
  Administered 2015-02-20: 1 g via INTRAMUSCULAR

## 2015-02-20 NOTE — ED Notes (Signed)
Pt complains of sore throat and L ear pain.  She was seen 2 days ago in primary care and was given and abx shot for strep but not given oral abx.  She says she has not gotten any better.  Painful to swallow and has had fevers on and off.

## 2015-02-20 NOTE — ED Provider Notes (Signed)
CSN: [id number]     Arrival date & time 02/20/15  1159 History   None    Chief Complaint  Patient presents with  . Sore Throat  . Otalgia    L   (Consider location/radiation/quality/duration/timing/severity/associated sxs/prior Treatment) HPI Pt complains of sore throat and L ear pain. She was seen 2 days ago in primary care and was given a shot of Bicillin for strep but not given oral abx. She says she has not gotten any better. Painful to swallow and has had fevers on and off. She states her son had similar strep throat but did not improve with penicillin, but he is now improving after taking cephalexin. Sore throat is severe. She is able to swallow. No nausea or vomiting. No abdominal pain. No chest pain or shortness of breath. No GYN symptoms. She denies chance of pregnancy.  Remainder of Review of Systems negative for acute change except as noted in the HPI.  Past Medical History  Diagnosis Date  . Depression   . Anxiety   . Allergy    Past Surgical History  Procedure Laterality Date  . Cholecystectomy  08-2009   Family History  Problem Relation Age of Onset  . Depression Mother   . Diabetes Father   . Hypertension Father   . Hyperlipidemia Father   . Depression Brother   . Colon cancer Maternal Grandfather   . Prostate cancer    . Melanoma     History  Substance Use Topics  . Smoking status: Former Smoker    Quit date: 11/14/2007  . Smokeless tobacco: Never Used  . Alcohol Use: Yes     Comment: once every coupld of month.    OB History    Gravida Para Term Preterm AB TAB SAB Ectopic Multiple Living   1 1 1       1      Review of Systems  Allergies  Latex  Home Medications   Prior to Admission medications   Medication Sig Start Date End Date Taking? Authorizing Provider  albuterol (PROVENTIL HFA;VENTOLIN HFA) 108 (90 BASE) MCG/ACT inhaler Inhale into the lungs every 6 (six) hours as needed for wheezing or shortness of breath.    Historical Provider,  MD  ALOE PO Take by mouth.    Historical Provider, MD  Alpha-D-Galactosidase (ECK FOOD ENZYME PO) Take by mouth.    Historical Provider, MD  ALPRAZolam ) 0.5 MG tablet Take 1 tablet (0.5 mg total) by mouth daily as needed for anxiety. 10/07/14   10/09/14, MD  AMBULATORY NON FORMULARY MEDICATION daily. Triple allergy defense    Historical Provider, MD  cephALEXin (KEFLEX) 500 MG capsule Take 1 capsule (500 mg total) by mouth 3 (three) times daily. For 10 days 02/20/15   04/22/15, MD  Ibuprofen-Famotidine 800-26.6 MG TABS Take 1 tablet by mouth 3 (three) times daily. 02/18/15   04/20/15, MD  Probiotic Product (PROBIOTIC DAILY PO) Take by mouth.    Historical Provider, MD   BP 107/73 mmHg  Pulse 100  Temp(Src) 98.3 F (36.8 C) (Oral)  Ht 5\' 4"  (1.626 m)  Wt 137 lb 8 oz (62.37 kg)  BMI 23.59 kg/m2  SpO2 100% Physical Exam  Constitutional: She is oriented to person, place, and time. She appears well-developed and well-nourished.  Non-toxic appearance. She appears ill. No distress.  HENT:  Head: Normocephalic and atraumatic.  Right Ear: Tympanic membrane, external ear and ear canal normal.  Left Ear: Tympanic membrane, external ear  and ear canal normal.  Nose: Nose normal. Right sinus exhibits no maxillary sinus tenderness and no frontal sinus tenderness. Left sinus exhibits no maxillary sinus tenderness and no frontal sinus tenderness.  Mouth/Throat: Uvula is midline and mucous membranes are normal. No oral lesions. Posterior oropharyngeal erythema present. No oropharyngeal exudate or tonsillar abscesses.  + Tonsillar enlargement.  Airway intact.  Eyes: Conjunctivae are normal. No scleral icterus.  Neck: Neck supple.  Cardiovascular: Normal rate, regular rhythm and normal heart sounds.   No murmur heard. Pulmonary/Chest: Effort normal and breath sounds normal. No stridor. No respiratory distress. She has no wheezes. She has no rhonchi. She has no rales.   Abdominal: Soft. She exhibits no mass. There is no hepatosplenomegaly. There is no tenderness.  Lymphadenopathy:    She has cervical adenopathy.       Right cervical: Superficial cervical adenopathy present. No deep cervical and no posterior cervical adenopathy present.      Left cervical: Superficial cervical adenopathy present. No deep cervical and no posterior cervical adenopathy present.  Neurological: She is alert and oriented to person, place, and time.  Skin: Skin is warm. No rash noted.  Psychiatric: She has a normal mood and affect.  Nursing note and vitals reviewed.   ED Course  Procedures (including critical care time) Labs Review Labs Reviewed - No data to display  Imaging Review No results found.   MDM   1. Exudative tonsillitis    Options discussed at length. After risks, benefits, alternatives discussed, patient agrees with the following plans: Rocephin 1 g IM stat New Prescriptions   CEPHALEXIN (KEFLEX) 500 MG CAPSULE    Take 1 capsule (500 mg total) by mouth 3 (three) times daily. For 10 days   other symptomatic care discussed Follow-up with your primary care doctor in 5-7 days if not improving, or sooner if symptoms become worse. Precautions discussed. Red flags discussed. Questions invited and answered. Patient voiced understanding and agreement.    Lajean Manes, MD 02/20/15 [phone]

## 2015-03-09 ENCOUNTER — Encounter: Payer: Self-pay | Admitting: Family Medicine

## 2015-03-09 ENCOUNTER — Ambulatory Visit (INDEPENDENT_AMBULATORY_CARE_PROVIDER_SITE_OTHER): Payer: BLUE CROSS/BLUE SHIELD | Admitting: Family Medicine

## 2015-03-09 VITALS — BP 125/75 | HR 71 | Ht 64.0 in | Wt 139.0 lb

## 2015-03-09 DIAGNOSIS — Z309 Encounter for contraceptive management, unspecified: Secondary | ICD-10-CM | POA: Diagnosis not present

## 2015-03-09 DIAGNOSIS — Z0000 Encounter for general adult medical examination without abnormal findings: Secondary | ICD-10-CM | POA: Diagnosis not present

## 2015-03-09 LAB — POCT URINE PREGNANCY: PREG TEST UR: NEGATIVE

## 2015-03-09 NOTE — Addendum Note (Signed)
Addended by: Deno Etienne on: 03/09/2015 03:51 PM   Modules accepted: Orders

## 2015-03-09 NOTE — Progress Notes (Signed)
  Subjective:     [name] is a 24 y.o. female and is here for a comprehensive physical exam. The patient reports problems - [facility]. She is on the Depo shot. She was due in January.  She has not had a period since then and she feels like her breasts or more tender and sore. She is sexually active. But doesn't think she's pregnant. She has irregular periods.  History   Social History  . Marital Status: Single    Spouse Name: N/A  . Number of Children: 1  . Years of Education: N/A   Occupational History  . 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 Smoking status: Former Smoker    Quit date: 11/14/2007  . Smokeless tobacco: Never Used  . Alcohol Use: Yes     Comment: once every coupld of month.   . Drug Use: No  . Sexual Activity:    Partners: Male     Comment:  HS diploma, 5 caffeine drinks daily,no regular exercise.   Other Topics Concern  . Not on file   Social History Narrative   Exercise 3-4 days per week. 1-2 cups  Caffeine per day. Marland Kitchen     Health Maintenance  Topic Date Due  . INFLUENZA VACCINE  06/14/2015  . PAP SMEAR  01/30/2017  . TETANUS/TDAP  02/15/2022  . HIV Screening  Completed    The following portions of the patient's history were reviewed and updated as appropriate: allergies, current medications, past family history, past medical history, past social history, past surgical history and problem list.  Review of Systems A comprehensive review of systems was negative.   Objective:    BP 125/75 mmHg  Pulse 71  Ht 5\' 4"  (1.626 m)  Wt 139 lb (63.05 kg)  BMI 23.85 kg/m2 General appearance: alert, cooperative and appears stated age Head: Normocephalic, without obvious abnormality, atraumatic Eyes: conj clear, EOMi, PEERLA Ears: normal TM's and external ear canals both ears Nose: Nares normal. Septum midline. Mucosa normal. No drainage or sinus tenderness. Throat: lips, mucosa, and  tongue normal; teeth and gums normal Neck: no adenopathy, no carotid bruit, no JVD, supple, symmetrical, trachea midline and thyroid not enlarged, symmetric, no tenderness/mass/nodules Back: symmetric, no curvature. ROM normal. No CVA tenderness. Lungs: clear to auscultation bilaterally Heart: regular rate and rhythm, S1, S2 normal, no murmur, click, rub or gallop Abdomen: soft, non-tender; bowel sounds normal; no masses,  no organomegaly Extremities: extremities normal, atraumatic, no cyanosis or edema Pulses: 2+ and symmetric Skin: Skin color, texture, turgor normal. No rashes or lesions Lymph nodes: Cervical, supraclavicular, and axillary nodes normal. Neurologic: Alert and oriented X 3, normal strength and tone. Normal symmetric reflexes. Normal coordination and gait    Assessment:    Healthy female exam.      Plan:     See After Visit Summary for Counseling Recommendations   Keep up a regular exercise program and make sure you are eating a healthy diet Try to eat 4 servings of dairy a day, or if you are lactose intolerant take a calcium with vitamin D daily.  Your vaccines are up to date.   Amenorrhea. We'll perform urine pregnancy test today. She plans to follow-up with GYN to restart Depo-Provera.

## 2015-03-09 NOTE — Patient Instructions (Signed)
Keep up a regular exercise program and make sure you are eating a healthy diet Try to eat 4 servings of dairy a day, or if you are lactose intolerant take a calcium with vitamin D daily.  Your vaccines are up to date.   

## 2015-03-10 ENCOUNTER — Other Ambulatory Visit: Payer: Self-pay | Admitting: *Deleted

## 2015-03-10 DIAGNOSIS — R748 Abnormal levels of other serum enzymes: Secondary | ICD-10-CM

## 2015-03-10 LAB — COMPLETE METABOLIC PANEL WITH GFR
ALT: 49 U/L — ABNORMAL HIGH (ref 0–35)
AST: 36 U/L (ref 0–37)
Albumin: 4.6 g/dL (ref 3.5–5.2)
Alkaline Phosphatase: 49 U/L (ref 39–117)
BILIRUBIN TOTAL: 0.7 mg/dL (ref 0.2–1.2)
BUN: 9 mg/dL (ref 6–23)
CO2: 27 mEq/L (ref 19–32)
Calcium: 9.6 mg/dL (ref 8.4–10.5)
Chloride: 102 mEq/L (ref 96–112)
Creat: 0.54 mg/dL (ref 0.50–1.10)
GFR, Est African American: >89 mL/min
GFR, Est Non African American: >89 mL/min
GLUCOSE: 83 mg/dL (ref 70–99)
POTASSIUM: 4.1 meq/L (ref 3.5–5.3)
Sodium: 138 mEq/L (ref 135–145)
Total Protein: 7.8 g/dL (ref 6.0–8.3)

## 2015-03-10 LAB — LIPID PANEL
CHOL/HDL RATIO: 2.4 ratio
CHOLESTEROL: 146 mg/dL (ref 0–200)
HDL: 61 mg/dL (ref >=46)
LDL CALC: 75 mg/dL (ref 0–99)
TRIGLYCERIDES: 51 mg/dL (ref <150)
VLDL: 10 mg/dL (ref 0–40)

## 2015-03-24 ENCOUNTER — Ambulatory Visit: Payer: BLUE CROSS/BLUE SHIELD | Admitting: Obstetrics & Gynecology

## 2015-03-30 ENCOUNTER — Encounter: Payer: Self-pay | Admitting: Obstetrics & Gynecology

## 2015-03-30 ENCOUNTER — Ambulatory Visit (INDEPENDENT_AMBULATORY_CARE_PROVIDER_SITE_OTHER): Payer: BLUE CROSS/BLUE SHIELD | Admitting: Obstetrics & Gynecology

## 2015-03-30 VITALS — BP 107/67 | HR 65 | Resp 16 | Ht 64.0 in | Wt 143.0 lb

## 2015-03-30 DIAGNOSIS — Z124 Encounter for screening for malignant neoplasm of cervix: Secondary | ICD-10-CM | POA: Diagnosis not present

## 2015-03-30 DIAGNOSIS — Z0000 Encounter for general adult medical examination without abnormal findings: Secondary | ICD-10-CM

## 2015-03-30 DIAGNOSIS — Z01812 Encounter for preprocedural laboratory examination: Secondary | ICD-10-CM

## 2015-03-30 DIAGNOSIS — Z01419 Encounter for gynecological examination (general) (routine) without abnormal findings: Secondary | ICD-10-CM | POA: Diagnosis not present

## 2015-03-30 DIAGNOSIS — Z3042 Encounter for surveillance of injectable contraceptive: Secondary | ICD-10-CM

## 2015-03-30 LAB — POCT URINE PREGNANCY: PREG TEST UR: NEGATIVE

## 2015-03-30 MED ORDER — MEDROXYPROGESTERONE ACETATE 150 MG/ML IM SUSP
150.0000 mg | INTRAMUSCULAR | Status: AC
  Administered 2015-03-30 – 2015-10-19 (×3): 150 mg via INTRAMUSCULAR

## 2015-03-30 NOTE — Progress Notes (Signed)
Subjective:    [name] is a 24 y.o. [name]  female who presents for an annual exam. The patient has no complaints today. She has no bleeding with the depo provera since her Mirena was removed. [name]!) The patient is sexually active. GYN screening history: last pap: was normal. The patient wears seatbelts: yes. The patient participates in regular exercise: yes. Has the patient ever been transfused or tattooed?: no. The patient reports that there is not domestic violence in her life.   Menstrual History: OB History    Gravida Para Term Preterm AB TAB SAB Ectopic Multiple Living   1 1 1       1       Menarche age: 67  No LMP recorded. Patient has had an injection.    The following portions of the patient's history were reviewed and updated as appropriate: allergies, current medications, past family history, past medical history, past social history, past surgical history and problem list.  Review of Systems Pertinent items are noted in HPI. 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   Objective:    BP 107/67 mmHg  Pulse 65  Resp 16  Ht 5\' 4"  (1.626 m)  Wt 143 lb (64.864 kg)  BMI 24.53 kg/m2  General Appearance:    Alert, cooperative, no distress, appears stated age  Head:    Normocephalic, without obvious abnormality, atraumatic  Eyes:    PERRL, conjunctiva/corneas clear, EOM's intact, fundi    benign, both eyes  Ears:    Normal TM's and external ear canals, both ears  Nose:   Nares normal, septum midline, mucosa normal, no drainage    or sinus tenderness  Throat:   Lips, mucosa, and tongue normal; teeth and gums normal  Neck:   Supple, symmetrical, trachea midline, no adenopathy;    thyroid:  no enlargement/tenderness/nodules; no carotid   bruit or JVD  Back:     Symmetric, no curvature, ROM normal, no CVA tenderness  Lungs:     Clear to auscultation bilaterally, respirations unlabored  Chest Wall:    No tenderness or  deformity   Heart:    Regular rate and rhythm, S1 and S2 normal, no murmur, rub   or gallop  Breast Exam:    No tenderness, masses, or nipple abnormality  Abdomen:     Soft, non-tender, bowel sounds active all four quadrants,    no masses, no organomegaly  Genitalia:    Normal female without lesion, discharge or tenderness, NSSA, NT, mobile, normal adnexal exam     Extremities:   Extremities normal, atraumatic, no cyanosis or edema  Pulses:   2+ and symmetric all extremities  Skin:   Skin color, texture, turgor normal, no rashes or lesions  Lymph nodes:   Cervical, supraclavicular, and axillary nodes normal  Neurologic:   CNII-XII intact, normal strength, sensation and reflexes    throughout  .    Assessment:    Healthy female exam.    Plan:     Breast self exam technique reviewed and patient encouraged to perform self-exam monthly. Thin prep Pap smear.

## 2015-04-05 LAB — CYTOLOGY - PAP

## 2015-04-26 ENCOUNTER — Other Ambulatory Visit: Payer: Self-pay | Admitting: Family Medicine

## 2015-04-26 DIAGNOSIS — R945 Abnormal results of liver function studies: Principal | ICD-10-CM

## 2015-04-26 DIAGNOSIS — R7989 Other specified abnormal findings of blood chemistry: Secondary | ICD-10-CM

## 2015-04-27 LAB — COMPLETE METABOLIC PANEL WITH GFR
ALBUMIN: 4.5 g/dL (ref 3.5–5.2)
ALT: 15 U/L (ref 0–35)
AST: 19 U/L (ref 0–37)
Alkaline Phosphatase: 46 U/L (ref 39–117)
BUN: 10 mg/dL (ref 6–23)
CALCIUM: 9.6 mg/dL (ref 8.4–10.5)
CHLORIDE: 102 meq/L (ref 96–112)
CO2: 25 meq/L (ref 19–32)
Creat: 0.64 mg/dL (ref 0.50–1.10)
GFR, Est Non African American: >89 mL/min
Glucose, Bld: 81 mg/dL (ref 70–99)
Potassium: 4 mEq/L (ref 3.5–5.3)
Sodium: 137 mEq/L (ref 135–145)
TOTAL PROTEIN: 7.9 g/dL (ref 6.0–8.3)
Total Bilirubin: 0.6 mg/dL (ref 0.2–1.2)

## 2015-04-27 NOTE — Progress Notes (Signed)
Quick Note:  All labs are normal. ______ 

## 2015-06-23 ENCOUNTER — Ambulatory Visit: Payer: BLUE CROSS/BLUE SHIELD

## 2015-07-07 ENCOUNTER — Other Ambulatory Visit: Payer: BLUE CROSS/BLUE SHIELD

## 2015-07-13 ENCOUNTER — Other Ambulatory Visit (INDEPENDENT_AMBULATORY_CARE_PROVIDER_SITE_OTHER): Payer: BLUE CROSS/BLUE SHIELD

## 2015-07-13 DIAGNOSIS — Z3042 Encounter for surveillance of injectable contraceptive: Secondary | ICD-10-CM

## 2015-07-13 DIAGNOSIS — Z3202 Encounter for pregnancy test, result negative: Secondary | ICD-10-CM | POA: Diagnosis not present

## 2015-07-13 LAB — POCT URINE PREGNANCY: Preg Test, Ur: NEGATIVE

## 2015-07-13 NOTE — Progress Notes (Signed)
Pt here for UPT.  She was past 2 weeks due on her Depo Provera.  Today's test was neg and she will abstain for 2 weeks and return for another UPT then can give Depo Provera if neg.

## 2015-07-27 ENCOUNTER — Ambulatory Visit (INDEPENDENT_AMBULATORY_CARE_PROVIDER_SITE_OTHER): Payer: BLUE CROSS/BLUE SHIELD | Admitting: *Deleted

## 2015-07-27 DIAGNOSIS — Z3042 Encounter for surveillance of injectable contraceptive: Secondary | ICD-10-CM | POA: Diagnosis not present

## 2015-10-01 ENCOUNTER — Ambulatory Visit (INDEPENDENT_AMBULATORY_CARE_PROVIDER_SITE_OTHER): Payer: BLUE CROSS/BLUE SHIELD | Admitting: Family Medicine

## 2015-10-01 ENCOUNTER — Encounter: Payer: Self-pay | Admitting: Family Medicine

## 2015-10-01 VITALS — BP 120/70 | HR 63 | Temp 98.3°F | Resp 18 | Wt 153.1 lb

## 2015-10-01 DIAGNOSIS — J3489 Other specified disorders of nose and nasal sinuses: Secondary | ICD-10-CM | POA: Diagnosis not present

## 2015-10-01 DIAGNOSIS — L74519 Primary focal hyperhidrosis, unspecified: Secondary | ICD-10-CM | POA: Diagnosis not present

## 2015-10-01 DIAGNOSIS — H65191 Other acute nonsuppurative otitis media, right ear: Secondary | ICD-10-CM

## 2015-10-01 DIAGNOSIS — H9203 Otalgia, bilateral: Secondary | ICD-10-CM | POA: Diagnosis not present

## 2015-10-01 DIAGNOSIS — R61 Generalized hyperhidrosis: Secondary | ICD-10-CM

## 2015-10-01 MED ORDER — PREDNISONE 20 MG PO TABS: 40.0000 mg | ORAL_TABLET | Freq: Every day | ORAL | Status: DC

## 2015-10-01 NOTE — Progress Notes (Signed)
   Subjective:    Patient ID: [name], female    DOB: 1991/05/18, 24 y.o.   MRN: [id number]  HPI Patient complains of bilateral ear pain 1 week with some low-grade temperatures up to 99.3 home. She says her daughter was sick as well and they took her to [facility] where she was swabbed for strep and was negative but was told that she had fluid behind both ears. Using benadryl fo rher allergies. No ST. Has a lot of pressure in her face and head.  No congestion..  No ST.   She wants referral to dermatology for hyperhydrosis for her underarms.  Says her current Dry-sol treatment is no longer effective.    Review of Systems     Objective:   Physical Exam  Constitutional: She is oriented to person, place, and time. She appears well-developed and well-nourished.  HENT:  Head: Normocephalic and atraumatic.  Right Ear: External ear normal.  Left Ear: External ear normal.  Nose: Nose normal.  Mouth/Throat: Oropharynx is clear and moist.  TMs and canals are clear. Small amout of fluid in behind the right TM  Eyes: Conjunctivae and EOM are normal. Pupils are equal, round, and reactive to light.  Neck: Neck supple. No thyromegaly present.  Cardiovascular: Normal rate, regular rhythm and normal heart sounds.   Pulmonary/Chest: Effort normal and breath sounds normal. She has no wheezes.  Lymphadenopathy:    She has no cervical adenopathy.  Neurological: She is alert and oriented to person, place, and time.  Skin: Skin is warm and dry.  Psychiatric: She has a normal mood and affect.          Assessment & Plan:  Bilateral ear pain - small effusion in the right ear. She has a lot of pressure in the sinus cavities but without significant amount of discharge or congestion. Will treat with oral prednisone for 5 days. If she's not improving then let me know. Also recommend that she go ahead and restart her nasal steroid sprays. She think she still has one at home.  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 Earlier in salt solution is not as effective and she is at the point of wanting further treatment such as Botox.

## 2015-10-19 ENCOUNTER — Ambulatory Visit (INDEPENDENT_AMBULATORY_CARE_PROVIDER_SITE_OTHER): Payer: BLUE CROSS/BLUE SHIELD | Admitting: *Deleted

## 2015-10-19 DIAGNOSIS — Z3042 Encounter for surveillance of injectable contraceptive: Secondary | ICD-10-CM | POA: Diagnosis not present

## 2015-11-19 ENCOUNTER — Ambulatory Visit (INDEPENDENT_AMBULATORY_CARE_PROVIDER_SITE_OTHER): Payer: BLUE CROSS/BLUE SHIELD | Admitting: Physician Assistant

## 2015-11-19 ENCOUNTER — Encounter: Payer: Self-pay | Admitting: Physician Assistant

## 2015-11-19 VITALS — BP 102/70 | Temp 97.3°F | Ht 64.0 in | Wt 159.0 lb

## 2015-11-19 DIAGNOSIS — J029 Acute pharyngitis, unspecified: Secondary | ICD-10-CM | POA: Diagnosis not present

## 2015-11-19 DIAGNOSIS — M6248 Contracture of muscle, other site: Secondary | ICD-10-CM

## 2015-11-19 DIAGNOSIS — M62838 Other muscle spasm: Secondary | ICD-10-CM

## 2015-11-19 LAB — POCT RAPID STREP A (OFFICE): Rapid Strep A Screen: NEGATIVE

## 2015-11-19 MED ORDER — CYCLOBENZAPRINE HCL 10 MG PO TABS: 10.0000 mg | ORAL_TABLET | Freq: Three times a day (TID) | ORAL | Status: DC | PRN

## 2015-11-19 NOTE — Patient Instructions (Signed)

## 2015-11-19 NOTE — Progress Notes (Signed)
   Subjective:    Patient ID: [name], female    DOB: 12/24/1990, 25 y.o.   MRN: [id number]  HPI  Patient is a 25 year old female who presents to the clinic with sore throat for 2 days. Thursday was very bad. She could not get out of bed and was very fatigued. It hurt to eat or drink. She woke up this morning and sore throat symptoms were improved. She does have some neck pain with side-to-side movement. She thinks she slept on it wrong on Wednesday night. She has not done anything to make symptoms better. She denies any sinus pressure, ear pain, cough, shortness of breath or wheezing. She has taken Benadryl with little benefit.    Review of Systems  All other systems reviewed and are negative.      Objective:   Physical Exam  Constitutional: She is oriented to person, place, and time. She appears well-developed and well-nourished.  HENT:  Head: Normocephalic and atraumatic.  Right Ear: External ear normal.  Left Ear: External ear normal.  Mouth/Throat: No oropharyngeal exudate.  TMs clear bilaterally.  Oropharynx erythematous without any tonsillar swelling or exudate. There was some postnasal drip present.  Bilateral nasal turbinates red and swollen.  Eyes: Conjunctivae are normal. Right eye exhibits no discharge. Left eye exhibits no discharge.  Neck: Normal range of motion. Neck supple.  Bilateral tender and swollen anterior cervical lymphadenopathy.  Range of motion of neck normal. There is some tenderness and pain with side-to-side movements.  Cardiovascular: Normal rate, regular rhythm and normal heart sounds.   Pulmonary/Chest: Effort normal and breath sounds normal. She has no wheezes.  Lymphadenopathy:    She has cervical adenopathy.  Neurological: She is alert and oriented to person, place, and time.  Psychiatric: She has a normal mood and affect. Her behavior is normal.          Assessment & Plan:  Acute pharyngitis-rapid strep negative. Discussed viral  etiology. Discussed symptomatic care today. Call if not improving or symptoms worsening.  Neck pain-suspect some type of muscle spasm. Flexeril 6 pharmacy to use as needed up to 3 times a day. Encouraged patient to use ibuprofen and warm compresses. Sedation warning given on Flexeril. Follow-up if symptoms worsen.

## 2015-11-23 ENCOUNTER — Telehealth: Payer: Self-pay

## 2015-11-23 MED ORDER — AMOXICILLIN-POT CLAVULANATE 875-125 MG PO TABS: 1.0000 | ORAL_TABLET | Freq: Two times a day (BID) | ORAL | Status: DC

## 2015-11-23 NOTE — Telephone Encounter (Signed)
Ok to send augmentin 875/125mg  bid for 10 days. #20 NRF

## 2015-11-23 NOTE — Telephone Encounter (Signed)
Patient called and reports a worsening of symptoms. She still has a sore throat and congestion with yellow mucus. She wants to know if she needs an antibiotic at this point.

## 2015-11-23 NOTE — Telephone Encounter (Signed)
Medication sent. Patient is aware.  

## 2016-01-11 ENCOUNTER — Ambulatory Visit: Payer: BLUE CROSS/BLUE SHIELD

## 2016-03-09 ENCOUNTER — Ambulatory Visit (INDEPENDENT_AMBULATORY_CARE_PROVIDER_SITE_OTHER): Payer: BLUE CROSS/BLUE SHIELD | Admitting: Family Medicine

## 2016-03-09 ENCOUNTER — Encounter: Payer: Self-pay | Admitting: Family Medicine

## 2016-03-09 VITALS — BP 131/68 | HR 73 | Ht 64.0 in | Wt 169.0 lb

## 2016-03-09 DIAGNOSIS — Z0000 Encounter for general adult medical examination without abnormal findings: Secondary | ICD-10-CM

## 2016-03-09 DIAGNOSIS — Z0189 Encounter for other specified special examinations: Secondary | ICD-10-CM | POA: Diagnosis not present

## 2016-03-09 NOTE — Progress Notes (Signed)
  Subjective:     ROLONDA PONTARELLI is a 25 y.o. female and is here for a comprehensive physical exam. The patient reports no problems.  Social History   Social History  . Marital Status: Single    Spouse Name: N/A  . Number of Children: 1  . Years of Education: N/A   Occupational History  . 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 Smoking status: Former Smoker    Quit date: 11/14/2007  . Smokeless tobacco: Never Used  . Alcohol Use: Yes     Comment: once every coupld of month.   . Drug Use: No  . Sexual Activity:    Partners: Male     Comment:  HS diploma, 5 caffeine drinks daily,no regular exercise.   Other Topics Concern  . Not on file   Social History Narrative   Exercise 3-4 days per week. 1-2 cups  Caffeine per day. Marland Kitchen     Health Maintenance  Topic Date Due  . INFLUENZA VACCINE  09/30/2016 (Originally 06/13/2016)  . PAP SMEAR  03/29/2018  . TETANUS/TDAP  02/15/2022  . HIV Screening  Completed    The following portions of the patient's history were reviewed and updated as appropriate: allergies, current medications, past family history, past medical history, past social history, past surgical history and problem list.  Review of Systems A comprehensive review of systems was negative.   Objective:    BP 131/68 mmHg  Pulse 73  Ht 5\' 4"  (1.626 m)  Wt 169 lb (76.658 kg)  BMI 28.99 kg/m2  SpO2 100% General appearance: alert, cooperative and appears stated age Head: Normocephalic, without obvious abnormality, atraumatic Eyes: conjunctivae/corneas clear. PERRL, EOM's intact. Fundi benign. Ears: normal TM's and external ear canals both ears Nose: Nares normal. Septum midline. Mucosa normal. No drainage or sinus tenderness. Throat: lips, mucosa, and tongue normal; teeth and gums normal Neck: no adenopathy, no carotid bruit, no JVD, supple, symmetrical, trachea midline and thyroid not enlarged, symmetric, no  tenderness/mass/nodules Back: symmetric, no curvature. ROM normal. No CVA tenderness. Lungs: clear to auscultation bilaterally Heart: regular rate and rhythm, S1, S2 normal, no murmur, click, rub or gallop Abdomen: soft, non-tender; bowel sounds normal; no masses,  no organomegaly Extremities: extremities normal, atraumatic, no cyanosis or edema Pulses: 2+ and symmetric Skin: Skin color, texture, turgor normal. No rashes or lesions Lymph nodes: Cervical, supraclavicular, and axillary nodes normal. Neurologic: Alert and oriented X 3, normal strength and tone. Normal symmetric reflexes. Normal coordination and gait    Assessment:    Healthy female exam.      Plan:     See After Visit Summary for Counseling Recommendations  Keep up a regular exercise program and make sure you are eating a healthy diet Try to eat 4 servings of dairy a day, or if you are lactose intolerant take a calcium with vitamin D daily.  Your vaccines are up to date.

## 2016-04-23 ENCOUNTER — Emergency Department (INDEPENDENT_AMBULATORY_CARE_PROVIDER_SITE_OTHER): Payer: BLUE CROSS/BLUE SHIELD

## 2016-04-23 ENCOUNTER — Emergency Department
Admission: EM | Admit: 2016-04-23 | Discharge: 2016-04-23 | Disposition: A | Discharge status: Home / Self Care | Payer: BLUE CROSS/BLUE SHIELD | Source: Home / Self Care | Attending: Family Medicine | Admitting: Family Medicine

## 2016-04-23 ENCOUNTER — Encounter: Payer: Self-pay | Admitting: Emergency Medicine

## 2016-04-23 DIAGNOSIS — M79672 Pain in left foot: Secondary | ICD-10-CM | POA: Diagnosis not present

## 2016-04-23 DIAGNOSIS — M25572 Pain in left ankle and joints of left foot: Secondary | ICD-10-CM

## 2016-04-23 MED ORDER — MELOXICAM 15 MG PO TABS: 15.0000 mg | ORAL_TABLET | Freq: Every day | ORAL | Status: DC

## 2016-04-23 NOTE — ED Provider Notes (Signed)
CSN: [id number]     Arrival date & time 04/23/16  1146 History   First MD Initiated Contact with Patient 04/23/16 1500     Chief Complaint  Patient presents with  . Foot Pain  . Ankle Pain      HPI Comments: Patient recalls that about one month ago her left foot suddenly became swollen and bruised on its dorsal surface.  She recalls no injury or trauma, and the symptoms improved.  Today she developed recurrent swelling in her left foot and ankle.  Patient is a 25 y.o. female presenting with lower extremity pain. The history is provided by the patient.  Foot Pain This is a recurrent problem. The current episode started 6 to 12 hours ago. The problem occurs constantly. The problem has been rapidly worsening. Pertinent negatives include no chest pain and no shortness of breath. The symptoms are aggravated by walking. Nothing relieves the symptoms. She has tried nothing for the symptoms.    Past Medical History  Diagnosis Date  . Depression   . Anxiety   . Allergy    Past Surgical History  Procedure Laterality Date  . Cholecystectomy  08-2009   Family History  Problem Relation Age of Onset  . Depression Mother   . Diabetes Father   . Hypertension Father   . Hyperlipidemia Father   . Depression Brother   . Colon cancer Maternal Grandfather   . Prostate cancer Maternal Grandfather   . Melanoma Maternal Grandfather   . Lung cancer Paternal Aunt     smoker    Social History  Substance Use Topics  . Smoking status: Former Smoker    Quit date: 11/14/2007  . Smokeless tobacco: Never Used  . Alcohol Use: Yes     Comment: once every coupld of month.    OB History    Gravida Para Term Preterm AB TAB SAB Ectopic Multiple Living   1 1 1       1      Review of Systems  Respiratory: Negative for shortness of breath.   Cardiovascular: Negative for chest pain.  All other systems reviewed and are negative.   Allergies  Latex  Home Medications   Prior to Admission medications     Medication Sig Start Date End Date Taking? Authorizing Provider  albuterol (PROVENTIL HFA;VENTOLIN HFA) 108 (90 BASE) MCG/ACT inhaler Inhale into the lungs every 6 (six) hours as needed for wheezing or shortness of breath.    Historical Provider, MD  DiphenhydrAMINE HCl (ALLERGY MED PO) Take by mouth.    Historical Provider, MD  meloxicam (MOBIC) 15 MG tablet Take 1 tablet (15 mg total) by mouth daily. Take with food each morning 04/23/16   06/23/16, MD  Probiotic Product (PROBIOTIC DAILY PO) Take by mouth.    Historical Provider, MD   Meds Ordered and Administered this Visit  Medications - No data to display  BP 114/73 mmHg  Pulse 100  Temp(Src) 98.3 F (36.8 C) (Oral)  Resp 16  Ht 5\' 4"  (1.626 m)  Wt 169 lb (76.658 kg)  BMI 28.99 kg/m2  SpO2 99% No data found.   Physical Exam  Constitutional: She is oriented to person, place, and time. She appears well-developed and well-nourished.  HENT:  Head: Normocephalic.  Mouth/Throat: Oropharynx is clear and moist.  Eyes: Conjunctivae are normal. Pupils are equal, round, and reactive to light.  Cardiovascular: Normal heart sounds.   Pulmonary/Chest: Breath sounds normal.  Abdominal: There is no tenderness.  Musculoskeletal:  She exhibits no edema.       Left ankle: Tenderness.       Feet:  Left foot has vague tenderness to palpation dorsally  as noted on diagram.  No obvious swelling.  No erythema or warmth.  No tenderness to palpation over extensor tendons.  Distal neurovascular function is intact. Right leg is approximately 1.5cm longer than the left by inspection.    Lymphadenopathy:    She has no cervical adenopathy.  Neurological: She is alert and oriented to person, place, and time.  Skin: Skin is warm and dry. No rash noted.  Nursing note and vitals reviewed.   ED Course  Procedures none   Imaging Review Dg Ankle Complete Left  04/23/2016  CLINICAL DATA:  Pt states she has had left foot/left ankle pain x 1  month with bruising and swelling. Denies injury. Pain is anterior and radiates to the medial side. EXAM: LEFT ANKLE COMPLETE - 3+ VIEW COMPARISON:  None. FINDINGS: There is no evidence of fracture, dislocation, or joint effusion. There is no evidence of arthropathy or other focal bone abnormality. Soft tissues are unremarkable. IMPRESSION: Negative. Electronically Signed   By: Esperanza Heir M.D.   On: 04/23/2016 14:16   Dg Foot Complete Left  04/23/2016  CLINICAL DATA:  Pt states she has had left foot/left ankle pain x 1 month with bruising and swelling. Denies injury. Pain is anterior and radiates to the medial side. EXAM: LEFT FOOT - COMPLETE 3+ VIEW COMPARISON:  None. FINDINGS: There is no evidence of fracture or dislocation. There is no evidence of arthropathy or other focal bone abnormality. Soft tissues are unremarkable. IMPRESSION: Negative. Electronically Signed   By: Esperanza Heir M.D.   On: 04/23/2016 14:15     MDM   1. Foot pain, left    Etiology unclear; ?due to leg length discrepancy. Rx for Mobic 15mg  QAM Wear well-fitting shoes Followup with Dr. or Dr. Rodney [facility] ([facility]) for further evaluation.    Clementeen Graham, MD 05/01/16 [phone]

## 2016-04-23 NOTE — ED Notes (Signed)
Patient injured left foot and ankle about 1 month ago; has gotten worse over past couple days.

## 2016-04-23 NOTE — Discharge Instructions (Signed)
Wear well-fitting shoes

## 2016-05-02 ENCOUNTER — Encounter: Payer: BLUE CROSS/BLUE SHIELD | Admitting: Sports Medicine

## 2016-05-18 ENCOUNTER — Ambulatory Visit (INDEPENDENT_AMBULATORY_CARE_PROVIDER_SITE_OTHER): Payer: BLUE CROSS/BLUE SHIELD | Admitting: Sports Medicine

## 2016-05-18 ENCOUNTER — Encounter: Payer: Self-pay | Admitting: Sports Medicine

## 2016-05-18 VITALS — BP 114/79 | HR 91 | Ht 64.0 in | Wt 173.0 lb

## 2016-05-18 DIAGNOSIS — M84374A Stress fracture, right foot, initial encounter for fracture: Secondary | ICD-10-CM

## 2016-05-18 NOTE — Assessment & Plan Note (Signed)
Postop shoe, return for custom orthotics, x-rays before return visit in one month.

## 2016-05-18 NOTE — Progress Notes (Signed)
   Subjective:    I'm seeing this patient as a consultation for:  Dr. Nani Gasser, Dr. Elsie Saas  CC: Left foot pain  HPI: This is a pleasant 25 year old female, for the past months she's noted pain that she localizes over the dorsum of the second metatarsal, moderate, persistent with radiation to the dorsum of the midfoot. She was seen in urgent care and referred to me for further evaluation and definitive treatment.  Past medical history, Surgical history, Family history not pertinant except as noted below, Social history, Allergies, and medications have been entered into the medical record, reviewed, and no changes needed.   Review of Systems: No headache, visual changes, nausea, vomiting, diarrhea, constipation, dizziness, abdominal pain, skin rash, fevers, chills, night sweats, weight loss, swollen lymph nodes, body aches, joint swelling, muscle aches, chest pain, shortness of breath, mood changes, visual or auditory hallucinations.   Objective:   General: Well Developed, well nourished, and in no acute distress.  Neuro/Psych: Alert and oriented x3, extra-ocular muscles intact, able to move all 4 extremities, sensation grossly intact. Skin: Warm and dry, no rashes noted.  Respiratory: Not using accessory muscles, speaking in full sentences, trachea midline.  Cardiovascular: Pulses palpable, no extremity edema. Abdomen: Does not appear distended. Left Foot: Trace visible swelling compared to the contralateral side Range of motion is full in all directions. Strength is 5/5 in all directions. No hallux valgus. No pes cavus or pes planus. No abnormal callus noted. No pain over the navicular prominence, or base of fifth metatarsal. No tenderness to palpation of the calcaneal insertion of plantar fascia. No pain at the Achilles insertion. No pain over the calcaneal bursa. No pain of the retrocalcaneal bursa. Tender to palpation of the dorsum of the second metatarsal at the  neck No hallux rigidus or limitus. No tenderness palpation over interphalangeal joints. No pain with compression of the metatarsal heads. Neurovascularly intact distally.  X-rays reviewed, there is periosteal elevation at the distal medial neck of the second metatarsal, discussed with radiology.  Impression and Recommendations:   This case required medical decision making of moderate complexity.

## 2016-05-25 ENCOUNTER — Ambulatory Visit (INDEPENDENT_AMBULATORY_CARE_PROVIDER_SITE_OTHER): Payer: Self-pay | Admitting: Sports Medicine

## 2016-05-25 ENCOUNTER — Encounter: Payer: Self-pay | Admitting: Sports Medicine

## 2016-05-25 VITALS — BP 112/75 | HR 89 | Resp 18 | Wt 173.4 lb

## 2016-05-25 DIAGNOSIS — M84374D Stress fracture, right foot, subsequent encounter for fracture with routine healing: Secondary | ICD-10-CM

## 2016-05-25 NOTE — Assessment & Plan Note (Signed)
Custom orthotics as above, pain is approximately the same this point. Continue postop shoe

## 2016-05-25 NOTE — Progress Notes (Signed)

## 2016-06-09 ENCOUNTER — Other Ambulatory Visit: Payer: Self-pay | Admitting: Sports Medicine

## 2016-06-09 ENCOUNTER — Encounter: Payer: Self-pay | Admitting: Sports Medicine

## 2016-06-09 ENCOUNTER — Ambulatory Visit (INDEPENDENT_AMBULATORY_CARE_PROVIDER_SITE_OTHER): Payer: 59 | Admitting: Sports Medicine

## 2016-06-09 ENCOUNTER — Ambulatory Visit (INDEPENDENT_AMBULATORY_CARE_PROVIDER_SITE_OTHER): Payer: 59

## 2016-06-09 DIAGNOSIS — X58XXXD Exposure to other specified factors, subsequent encounter: Secondary | ICD-10-CM | POA: Diagnosis not present

## 2016-06-09 DIAGNOSIS — M84374A Stress fracture, right foot, initial encounter for fracture: Secondary | ICD-10-CM

## 2016-06-09 DIAGNOSIS — M84374D Stress fracture, right foot, subsequent encounter for fracture with routine healing: Secondary | ICD-10-CM | POA: Diagnosis not present

## 2016-06-09 DIAGNOSIS — M84375D Stress fracture, left foot, subsequent encounter for fracture with routine healing: Secondary | ICD-10-CM

## 2016-06-09 NOTE — Assessment & Plan Note (Signed)
There was evidence of periosteal reaction on the second metatarsal shaft. Unfortunately has worsened a bit with moderate weightbearing and a postop shoe. At this point we are going to make her completely nonweightbearing with crutches, obtain an MRI of the foot for confirmation of the diagnosis and likely place a short-leg cast at the MRI follow-up.

## 2016-06-09 NOTE — Progress Notes (Signed)
  Subjective:    CC: Follow-up  HPI: This is a pleasant 25 year old female, she has a second metatarsal stress fracture, unfortunately despite postop shoe immobilization she continues to have pain, slightly worse than before. We have had her in the postop shoe for a month now, pain is moderate, worsening. She has been treated for greater than 6 weeks overall.  Past medical history, Surgical history, Family history not pertinant except as noted below, Social history, Allergies, and medications have been entered into the medical record, reviewed, and no changes needed.   Review of Systems: No fevers, chills, night sweats, weight loss, chest pain, or shortness of breath.   Objective:    General: Well Developed, well nourished, and in no acute distress.  Neuro: Alert and oriented x3, extra-ocular muscles intact, sensation grossly intact.  HEENT: Normocephalic, atraumatic, pupils equal round reactive to light, neck supple, no masses, no lymphadenopathy, thyroid nonpalpable.  Skin: Warm and dry, no rashes. Cardiac: Regular rate and rhythm, no murmurs rubs or gallops, no lower extremity edema.  Respiratory: Clear to auscultation bilaterally. Not using accessory muscles, speaking in full sentences. Left Foot: No visible erythema or swelling. Range of motion is full in all directions. Strength is 5/5 in all directions. No hallux valgus. No pes cavus or pes planus. No abnormal callus noted. No pain over the navicular prominence, or base of fifth metatarsal. No tenderness to palpation of the calcaneal insertion of plantar fascia. No pain at the Achilles insertion. No pain over the calcaneal bursa. No pain of the retrocalcaneal bursa. Tender to palpation of the second metatarsal shaft with swelling No hallux rigidus or limitus. No tenderness palpation over interphalangeal joints. No pain with compression of the metatarsal heads. Neurovascularly intact distally.  Impression and  Recommendations:    Metatarsal stress fracture of right foot There was evidence of periosteal reaction on the second metatarsal shaft. Unfortunately has worsened a bit with moderate weightbearing and a postop shoe. At this point we are going to make her completely nonweightbearing with crutches, obtain an MRI of the foot for confirmation of the diagnosis and likely place a short-leg cast at the MRI follow-up.

## 2016-06-12 ENCOUNTER — Encounter: Payer: Self-pay | Admitting: Sports Medicine

## 2016-06-12 ENCOUNTER — Ambulatory Visit (INDEPENDENT_AMBULATORY_CARE_PROVIDER_SITE_OTHER): Payer: 59 | Admitting: Sports Medicine

## 2016-06-12 DIAGNOSIS — M84374D Stress fracture, right foot, subsequent encounter for fracture with routine healing: Secondary | ICD-10-CM | POA: Diagnosis not present

## 2016-06-12 MED ORDER — MELOXICAM 15 MG PO TABS: 15.0000 mg | ORAL_TABLET | Freq: Every day | ORAL | 0 refills | Status: DC

## 2016-06-12 NOTE — Progress Notes (Signed)
   This pleasant 25 year old female is here for cast placement, initially we planned MRI, there were no openings so it's going to be next week, so she will need cast immobilization in the meantime. Short-leg walking cast placed today.

## 2016-06-12 NOTE — Assessment & Plan Note (Signed)
MRI will probably not be until next week, short-leg cast placed today with walker.

## 2016-06-15 ENCOUNTER — Ambulatory Visit: Payer: BLUE CROSS/BLUE SHIELD | Admitting: Sports Medicine

## 2016-06-15 ENCOUNTER — Telehealth: Payer: Self-pay

## 2016-06-15 NOTE — Telephone Encounter (Signed)
Pt states that due to the angling from the cast she feels it's putting pressure on her

## 2016-06-16 ENCOUNTER — Ambulatory Visit (INDEPENDENT_AMBULATORY_CARE_PROVIDER_SITE_OTHER): Payer: 59 | Admitting: Sports Medicine

## 2016-06-16 DIAGNOSIS — M84374D Stress fracture, right foot, subsequent encounter for fracture with routine healing: Secondary | ICD-10-CM | POA: Diagnosis not present

## 2016-06-16 NOTE — Progress Notes (Signed)
  Subjective:    CC: Cast problem  HPI: This is a pleasant 25 year old female, we've been treating her for a second metatarsal stress fracture, she was placed a cast approximately 5 days ago, unfortunately has not been able to tolerate cast.  Past medical history, Surgical history, Family history not pertinant except as noted below, Social history, Allergies, and medications have been entered into the medical record, reviewed, and no changes needed.   Review of Systems: No fevers, chills, night sweats, weight loss, chest pain, or shortness of breath.   Objective:    General: Well Developed, well nourished, and in no acute distress.  Neuro: Alert and oriented x3, extra-ocular muscles intact, sensation grossly intact.  HEENT: Normocephalic, atraumatic, pupils equal round reactive to light, neck supple, no masses, no lymphadenopathy, thyroid nonpalpable.  Skin: Warm and dry, no rashes. Cardiac: Regular rate and rhythm, no murmurs rubs or gallops, no lower extremity edema.  Respiratory: Clear to auscultation bilaterally. Not using accessory muscles, speaking in full sentences. Right foot: Cast is removed, still tender to palpation over the fracture.  Impression and Recommendations:    Metatarsal stress fracture of right foot Unable to tolerate cast after 5 days, it was removed and she will continue with a full length boot which she tells me she has at home. Keep four-week follow-up.

## 2016-06-16 NOTE — Assessment & Plan Note (Signed)
Unable to tolerate cast after 5 days, it was removed and she will continue with a full length boot which she tells me she has at home. Keep four-week follow-up.

## 2016-06-19 ENCOUNTER — Other Ambulatory Visit: Payer: 59

## 2016-07-07 ENCOUNTER — Ambulatory Visit (INDEPENDENT_AMBULATORY_CARE_PROVIDER_SITE_OTHER): Payer: 59 | Admitting: Sports Medicine

## 2016-07-07 ENCOUNTER — Encounter: Payer: Self-pay | Admitting: Sports Medicine

## 2016-07-07 DIAGNOSIS — M84374D Stress fracture, right foot, subsequent encounter for fracture with routine healing: Secondary | ICD-10-CM

## 2016-07-07 NOTE — Assessment & Plan Note (Signed)
Clinically resolved, may wear the boot half the day for the next week and then discontinue altogether. Return as needed.

## 2016-07-07 NOTE — Progress Notes (Signed)
  Subjective:    CC: Follow-up  HPI: Has now been treated for approximately 4 weeks, with 3 weeks in a postop shoe, currently pain-free.  Past medical history, Surgical history, Family history not pertinant except as noted below, Social history, Allergies, and medications have been entered into the medical record, reviewed, and no changes needed.   Review of Systems: No fevers, chills, night sweats, weight loss, chest pain, or shortness of breath.   Objective:    General: Well Developed, well nourished, and in no acute distress.  Neuro: Alert and oriented x3, extra-ocular muscles intact, sensation grossly intact.  HEENT: Normocephalic, atraumatic, pupils equal round reactive to light, neck supple, no masses, no lymphadenopathy, thyroid nonpalpable.  Skin: Warm and dry, no rashes. Cardiac: Regular rate and rhythm, no murmurs rubs or gallops, no lower extremity edema.  Respiratory: Clear to auscultation bilaterally. Not using accessory muscles, speaking in full sentences. Right Foot: No visible erythema or swelling. Range of motion is full in all directions. Strength is 5/5 in all directions. No hallux valgus. No pes cavus or pes planus. No abnormal callus noted. No pain over the navicular prominence, or base of fifth metatarsal. No tenderness to palpation of the calcaneal insertion of plantar fascia. No pain at the Achilles insertion. No pain over the calcaneal bursa. No pain of the retrocalcaneal bursa. No tenderness to palpation over the tarsals, metatarsals, or phalanges. No hallux rigidus or limitus. No tenderness palpation over interphalangeal joints. No pain with compression of the metatarsal heads. Neurovascularly intact distally.  Impression and Recommendations:    Metatarsal stress fracture of right foot Clinically resolved, may wear the boot half the day for the next week and then discontinue altogether. Return as needed.

## 2016-10-13 ENCOUNTER — Ambulatory Visit: Payer: 59 | Admitting: Family Medicine

## 2016-10-27 ENCOUNTER — Ambulatory Visit (INDEPENDENT_AMBULATORY_CARE_PROVIDER_SITE_OTHER): Payer: 59 | Admitting: Sports Medicine

## 2016-10-27 DIAGNOSIS — J209 Acute bronchitis, unspecified: Secondary | ICD-10-CM | POA: Diagnosis not present

## 2016-10-27 DIAGNOSIS — M84374D Stress fracture, right foot, subsequent encounter for fracture with routine healing: Secondary | ICD-10-CM

## 2016-10-27 MED ORDER — BENZONATATE 200 MG PO CAPS: 200.0000 mg | ORAL_CAPSULE | Freq: Three times a day (TID) | ORAL | 0 refills | Status: DC | PRN

## 2016-10-27 NOTE — Assessment & Plan Note (Signed)
Resolving mild cough with a normal exam. Tessalon Perles, return as needed.

## 2016-10-27 NOTE — Patient Instructions (Signed)

## 2016-10-27 NOTE — Progress Notes (Signed)
  Subjective:    CC:  Coughing  HPI: For the past week and a half this pleasant 25 year old female has had a mild cough, only mildly productive. It has started to resolve over the last day. No soreness of breath, chest pain, no fevers or chills. No GI symptoms, no skin rash.  Foot pain: Right-sided, persistent, history of metatarsal stress fracture treated conservatively. We have never obtained an MRI, only several x-rays.  Past medical history:  Negative.  See flowsheet/record as well for more information.  Surgical history: Negative.  See flowsheet/record as well for more information.  Family history: Negative.  See flowsheet/record as well for more information.  Social history: Negative.  See flowsheet/record as well for more information.  Allergies, and medications have been entered into the medical record, reviewed, and no changes needed.   Review of Systems: No fevers, chills, night sweats, weight loss, chest pain, or shortness of breath.   Objective:    General: Well Developed, well nourished, and in no acute distress.  Neuro: Alert and oriented x3, extra-ocular muscles intact, sensation grossly intact.  HEENT: Normocephalic, atraumatic, pupils equal round reactive to light, neck supple, no masses, no lymphadenopathy, thyroid nonpalpable.  Skin: Warm and dry, no rashes. Cardiac: Regular rate and rhythm, no murmurs rubs or gallops, no lower extremity edema.  Respiratory: Clear to auscultation bilaterally. Not using accessory muscles, speaking in full sentences. Right Foot: Swollen and diffusely tender over the dorsal midfoot and the metatarsal shafts Range of motion is full in all directions. Strength is 5/5 in all directions. No hallux valgus. No pes cavus or pes planus. No abnormal callus noted. No pain over the navicular prominence, or base of fifth metatarsal. No tenderness to palpation of the calcaneal insertion of plantar fascia. No pain at the Achilles insertion. No  pain over the calcaneal bursa. No pain of the retrocalcaneal bursa. No tenderness to palpation over the tarsals, metatarsals, or phalanges. No hallux rigidus or limitus. No tenderness palpation over interphalangeal joints. No pain with compression of the metatarsal heads. Neurovascularly intact distally.  Impression and Recommendations:    Acute bronchitis Resolving mild cough with a normal exam. Tessalon Perles, return as needed.  Metatarsal stress fracture of right foot Unfortunately having persistent and recurrent pain. Ordering an MRI.

## 2016-10-27 NOTE — Assessment & Plan Note (Signed)
Unfortunately having persistent and recurrent pain. Ordering an MRI.

## 2016-11-08 ENCOUNTER — Ambulatory Visit: Payer: 59 | Admitting: Physician Assistant

## 2016-11-09 ENCOUNTER — Ambulatory Visit: Payer: 59 | Admitting: Family Medicine

## 2016-11-09 NOTE — Progress Notes (Deleted)
   Subjective:    Patient ID: Georganna Skeans, female    DOB: 02/12/1991, 25 y.o.   MRN: [id number]  HPI 25 year old female comes in today complaining of cough. She was seen about 2 weeks ago for acute bronchitis, she is Artie had symptoms for about a week and have a that time. The cough is gradually improving and so she was just treated with Tessalon Perles. Now 2 weeks later she's been coughing for four-month.   Review of Systems     Objective:   Physical Exam  Constitutional: She is oriented to person, place, and time. She appears well-developed and well-nourished.  HENT:  Head: Normocephalic and atraumatic.  Right Ear: External ear normal.  Left Ear: External ear normal.  Nose: Nose normal.  Mouth/Throat: Oropharynx is clear and moist.  TMs and canals are clear.   Eyes: Conjunctivae and EOM are normal. Pupils are equal, round, and reactive to light.  Neck: Neck supple. No thyromegaly present.  Cardiovascular: Normal rate, regular rhythm and normal heart sounds.   Pulmonary/Chest: Effort normal and breath sounds normal. She has no wheezes.  Lymphadenopathy:    She has no cervical adenopathy.  Neurological: She is alert and oriented to person, place, and time.  Skin: Skin is warm and dry.  Psychiatric: She has a normal mood and affect.          Assessment & Plan:

## 2016-11-27 ENCOUNTER — Encounter: Payer: Self-pay | Admitting: Family Medicine

## 2016-11-27 ENCOUNTER — Ambulatory Visit (INDEPENDENT_AMBULATORY_CARE_PROVIDER_SITE_OTHER): Payer: 59 | Admitting: Family Medicine

## 2016-11-27 VITALS — BP 112/63 | HR 67 | Wt 174.0 lb

## 2016-11-27 DIAGNOSIS — Z0000 Encounter for general adult medical examination without abnormal findings: Secondary | ICD-10-CM

## 2016-11-27 NOTE — Patient Instructions (Signed)
Keep up a regular exercise program and make sure you are eating a healthy diet Try to eat 4 servings of dairy a day, or if you are lactose intolerant take a calcium with vitamin D daily.  Your vaccines are up to date.   

## 2016-11-27 NOTE — Progress Notes (Signed)
Subjective:     [name] is a 26 y.o. female and is here for a comprehensive physical exam. The patient reports no problems.  Social History   Social History  . Marital status: Single    Spouse name: N/A  . Number of children: 1  . Years of education: N/A   Occupational History  . 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 Smoking status: Former Smoker    Quit date: 11/14/2007  . Smokeless tobacco: Never Used  . Alcohol use Yes     Comment: once every coupld of month.   . Drug use: No  . Sexual activity: Yes    Partners: Male     Comment:  HS diploma, 5 caffeine drinks daily,no regular exercise.   Other Topics Concern  . Not on file   Social History Narrative   Exercise 3-4 days per week. 1-2 cups  Caffeine per day. Marland Kitchen     Health Maintenance  Topic Date Due  . INFLUENZA VACCINE  11/27/2017 (Originally 06/13/2016)  . PAP SMEAR  03/29/2018  . TETANUS/TDAP  02/15/2022  . HIV Screening  Completed    The following portions of the patient's history were reviewed and updated as appropriate: allergies, current medications, past family history, past medical history, past social history, past surgical history and problem list.  Review of Systems A comprehensive review of systems was negative.   Objective:    BP 112/63   Pulse 67   Wt 174 lb (78.9 kg)   BMI 29.87 kg/m   General appearance: alert, cooperative and appears stated age Head: Normocephalic, without obvious abnormality, atraumatic Eyes: conj clear, EOMi, PEERLA Ears: normal TM's and external ear canals both ears Nose: Nares normal. Septum midline. Mucosa normal. No drainage or sinus tenderness. Throat: lips, mucosa, and tongue normal; teeth and gums normal Neck: no adenopathy, no carotid bruit, no JVD, supple, symmetrical, trachea midline and thyroid not enlarged, symmetric, no tenderness/mass/nodules Back: symmetric, no curvature. ROM normal. No CVA  tenderness. Lungs: clear to auscultation bilaterally Heart: regular rate and rhythm, S1, S2 normal, no murmur, click, rub or gallop Abdomen: soft, non-tender; bowel sounds normal; no masses,  no organomegaly Extremities: extremities normal, atraumatic, no cyanosis or edema Pulses: 2+ and symmetric Skin: Skin color, texture, turgor normal. No rashes or lesions. She does have a dermatofibroma on the posterior right lower leg. Gave reassurance.  Lymph nodes: Cervical adenopathy: nl and Axillary adenopathy: nl Neurologic: Alert and oriented X 3, normal strength and tone. Normal symmetric reflexes. Normal coordination and gait    Assessment:    Healthy female exam.      Plan:     See After Visit Summary for Counseling Recommendations   Keep up a regular exercise program and make sure you are eating a healthy diet Try to eat 4 servings of dairy a day, or if you are lactose intolerant take a calcium with vitamin D daily.  Your vaccines are up to date.

## 2016-12-07 ENCOUNTER — Other Ambulatory Visit: Payer: Self-pay | Admitting: Chiropractic Medicine

## 2016-12-07 ENCOUNTER — Ambulatory Visit (INDEPENDENT_AMBULATORY_CARE_PROVIDER_SITE_OTHER): Payer: 59

## 2016-12-07 DIAGNOSIS — Z87891 Personal history of nicotine dependence: Secondary | ICD-10-CM

## 2016-12-07 DIAGNOSIS — R0781 Pleurodynia: Secondary | ICD-10-CM | POA: Diagnosis not present

## 2016-12-07 DIAGNOSIS — R05 Cough: Secondary | ICD-10-CM

## 2016-12-07 LAB — COMPLETE METABOLIC PANEL WITH GFR
ALBUMIN: 4.3 g/dL (ref 3.6–5.1)
ALK PHOS: 73 U/L (ref 33–115)
ALT: 24 U/L (ref 6–29)
AST: 19 U/L (ref 10–30)
BUN: 10 mg/dL (ref 7–25)
CALCIUM: 9.8 mg/dL (ref 8.6–10.2)
CHLORIDE: 103 mmol/L (ref 98–110)
CO2: 22 mmol/L (ref 20–31)
CREATININE: 0.63 mg/dL (ref 0.50–1.10)
GFR, Est African American: >89 mL/min (ref >=60)
GFR, Est Non African American: >89 mL/min (ref >=60)
Glucose, Bld: 78 mg/dL (ref 65–99)
Potassium: 4.4 mmol/L (ref 3.5–5.3)
Sodium: 141 mmol/L (ref 135–146)
Total Bilirubin: 0.6 mg/dL (ref 0.2–1.2)
Total Protein: 7.5 g/dL (ref 6.1–8.1)

## 2016-12-07 LAB — LIPID PANEL
CHOL/HDL RATIO: 2.9 ratio (ref <5.0)
CHOLESTEROL: 162 mg/dL (ref <200)
HDL: 56 mg/dL (ref >50)
LDL Cholesterol: 94 mg/dL (ref <100)
TRIGLYCERIDES: 61 mg/dL (ref <150)
VLDL: 12 mg/dL (ref <30)

## 2016-12-08 ENCOUNTER — Encounter: Payer: Self-pay | Admitting: *Deleted

## 2016-12-08 NOTE — Progress Notes (Signed)
All labs are normal. 

## 2017-02-15 ENCOUNTER — Encounter: Payer: Self-pay | Admitting: Obstetrics & Gynecology

## 2017-02-15 ENCOUNTER — Ambulatory Visit (INDEPENDENT_AMBULATORY_CARE_PROVIDER_SITE_OTHER): Payer: 59 | Admitting: Obstetrics & Gynecology

## 2017-02-15 VITALS — BP 124/81 | HR 93 | Resp 16 | Ht 65.0 in | Wt 172.0 lb

## 2017-02-15 DIAGNOSIS — Z113 Encounter for screening for infections with a predominantly sexual mode of transmission: Secondary | ICD-10-CM

## 2017-02-15 DIAGNOSIS — Z124 Encounter for screening for malignant neoplasm of cervix: Secondary | ICD-10-CM

## 2017-02-15 DIAGNOSIS — Z01419 Encounter for gynecological examination (general) (routine) without abnormal findings: Secondary | ICD-10-CM

## 2017-02-15 MED ORDER — NORELGESTROMIN-ETH ESTRADIOL 150-35 MCG/24HR TD PTWK: 1.0000 | MEDICATED_PATCH | TRANSDERMAL | 12 refills | Status: DC

## 2017-02-15 NOTE — Progress Notes (Signed)
Subjective:    [name] is a 26 y.o. SW P47 (55 yo daughter)female who presents for an annual exam. The patient has no complaints today. The patient is not currently sexually active. GYN screening history: last pap: was normal. The patient wears seatbelts: yes. The patient participates in regular exercise: yes. Has the patient ever been transfused or tattooed?: no. The patient reports that there is not domestic violence in her life.   Menstrual History: OB History    Gravida Para Term Preterm AB Living   1 1 1     1    SAB TAB Ectopic Multiple Live Births                  Menarche age: 13 Patient's last menstrual period was 02/08/2017.    The following portions of the patient's history were reviewed and updated as appropriate: allergies, current medications, past family history, past medical history, past social history, past surgical history and problem list.  Review of Systems Pertinent items are noted in HPI.   She finally started back having periods after being off of depo provera for a year (had gained about 27# with depo provera). Not happy having periods.   Objective:    BP 124/81   Pulse 93   Resp 16   Ht 5\' 5"  (1.651 m)   Wt 172 lb (78 kg)   LMP 02/08/2017   BMI 28.62 kg/m   General Appearance:    Alert, cooperative, no distress, appears stated age  Head:    Normocephalic, without obvious abnormality, atraumatic  Eyes:    PERRL, conjunctiva/corneas clear, EOM's intact, fundi    benign, both eyes  Ears:    Normal TM's and external ear canals, both ears  Nose:   Nares normal, septum midline, mucosa normal, no drainage    or sinus tenderness  Throat:   Lips, mucosa, and tongue normal; teeth and gums normal  Neck:   Supple, symmetrical, trachea midline, no adenopathy;    thyroid:  no enlargement/tenderness/nodules; no carotid   bruit or JVD  Back:     Symmetric, no curvature, ROM normal, no CVA tenderness  Lungs:     Clear to auscultation bilaterally,  respirations unlabored  Chest Wall:    No tenderness or deformity   Heart:    Regular rate and rhythm, S1 and S2 normal, no murmur, rub   or gallop  Breast Exam:    No tenderness, masses, or nipple abnormality  Abdomen:     Soft, non-tender, bowel sounds active all four quadrants,    no masses, no organomegaly  Genitalia:    Normal female without lesion, discharge or tenderness, NSSA, NT, mobile, no adnexal masses or tenderness     Extremities:   Extremities normal, atraumatic, no cyanosis or edema  Pulses:   2+ and symmetric all extremities  Skin:   Skin color, texture, turgor normal, no rashes or lesions  Lymph nodes:   Cervical, supraclavicular, and axillary nodes normal  Neurologic:   CNII-XII intact, normal strength, sensation and reflexes    throughout  .    Assessment:    Healthy female exam.    Plan:     Thin prep Pap smear. with cultures She has opted for ortho evra patch Rec back up for a month BP check in 2 weeks

## 2017-02-20 LAB — CYTOLOGY - PAP
Chlamydia: NEGATIVE
Diagnosis: NEGATIVE
Neisseria Gonorrhea: NEGATIVE

## 2017-02-21 ENCOUNTER — Ambulatory Visit (INDEPENDENT_AMBULATORY_CARE_PROVIDER_SITE_OTHER): Payer: 59 | Admitting: Osteopathic Medicine

## 2017-02-21 ENCOUNTER — Encounter: Payer: Self-pay | Admitting: Osteopathic Medicine

## 2017-02-21 ENCOUNTER — Telehealth: Payer: Self-pay | Admitting: *Deleted

## 2017-02-21 VITALS — BP 125/64 | HR 76 | Wt 173.0 lb

## 2017-02-21 DIAGNOSIS — B3731 Acute candidiasis of vulva and vagina: Secondary | ICD-10-CM

## 2017-02-21 DIAGNOSIS — B373 Candidiasis of vulva and vagina: Secondary | ICD-10-CM

## 2017-02-21 DIAGNOSIS — N898 Other specified noninflammatory disorders of vagina: Secondary | ICD-10-CM

## 2017-02-21 MED ORDER — METRONIDAZOLE 500 MG PO TABS: 500.0000 mg | ORAL_TABLET | Freq: Two times a day (BID) | ORAL | 0 refills | Status: DC

## 2017-02-21 MED ORDER — FLUCONAZOLE 150 MG PO TABS: 150.0000 mg | ORAL_TABLET | Freq: Once | ORAL | 1 refills | Status: AC

## 2017-02-21 MED ORDER — FLUCONAZOLE 150 MG PO TABS: 150.0000 mg | ORAL_TABLET | Freq: Once | ORAL | 0 refills | Status: DC

## 2017-02-21 NOTE — Progress Notes (Signed)
HPI: [name] is a 26 y.o. female who presents to [name] today for chief complaint of:  Chief Complaint  Patient presents with  . Vaginitis   . Location: vaginal  . Quality: discharge and odor  . Duration: few days . Timing: constant . Context: recent change birth control, recent GYN checkup .    Past medical, social, surgical and family history reviewed. Current medications and allergies reviewed.   Review of Systems: CONSTITUTIONAL:  No  fever, no chills GASTROINTESTINAL: No  nausea, No  vomiting, No  abdominal pain,  GENITOURINARY: No  incontinence, No urinary pain, no urinary frequency, No  abnormal genital bleeding, +abnormal genital discharge SKIN: No  rash/wounds/concerning lesions in genital area    Exam:  BP 125/64   Pulse 76   Wt 173 lb (78.5 kg)   LMP 02/08/2017   BMI 28.79 kg/m  Constitutional: VS see above. General Appearance: alert, well-developed, well-nourished, NAD Respiratory: Normal respiratory effort.      ASSESSMENT/PLAN: declines vaginal swab today - tx as BV/Yeast but Ddx includes physiologic discharge d/t OCP change  Vaginal discharge - Plan: metroNIDAZOLE (FLAGYL) 500 MG tablet  Vaginal yeast infection - Plan: fluconazole (DIFLUCAN) 150 MG tablet   Patient Instructions  Plan: 1. Diflucan for yeast infection 2. If no better, fill Rx for Metronidazole  3. If no better after that, come see [place] or OBGYN for vaginal swab     Return if symptoms worsen or fail to improve.

## 2017-02-21 NOTE — Patient Instructions (Signed)
Plan: 1. Diflucan for yeast infection 2. If no better, fill Rx for Metronidazole  3. If no better after that, come see [place] or OBGYN for vaginal swab

## 2017-02-21 NOTE — Telephone Encounter (Signed)
Pt was seen in office last week for her annual.  She calls today stating that she is having white curd like d/c and vaginal itching.  She is requesting a RX for Diflucan.  Spoke with Dr Marice Potter who okl'd the RX.  This was sent to [facility]

## 2017-03-01 ENCOUNTER — Ambulatory Visit: Payer: 59

## 2017-03-01 VITALS — BP 106/75 | HR 72

## 2017-03-01 DIAGNOSIS — Z0130 Encounter for examination of blood pressure without abnormal findings: Secondary | ICD-10-CM

## 2017-03-01 NOTE — Progress Notes (Signed)
Pt started the patch so Dr.Dove wanted her to come in for a BP check. BP was normal reading 106/75. PT has no complaints.

## 2017-03-12 ENCOUNTER — Telehealth: Payer: Self-pay

## 2017-03-12 MED ORDER — MEDROXYPROGESTERONE ACETATE 5 MG PO TABS: 5.0000 mg | ORAL_TABLET | Freq: Every day | ORAL | 0 refills | Status: DC

## 2017-03-12 NOTE — Telephone Encounter (Signed)
It is about 4 weeks from the previous one, but it has lasted 12 days now, and the last 5 heavy.

## 2017-03-12 NOTE — Telephone Encounter (Signed)
If she supposed to be having her menstrual cycle right now or is this actually a little bit early?  I just want to make sure that the bleeding is at the appropriate time and it's just heavy or if she is actually bleeding early and it's very heavy

## 2017-03-12 NOTE — Telephone Encounter (Signed)
Will send her prescription for Provera. This will slow down the bleeding area did go ahead and start the new pill pack when due to start it.

## 2017-03-12 NOTE — Telephone Encounter (Signed)
Notified patient.

## 2017-03-12 NOTE — Telephone Encounter (Signed)
Pt called to ask about being seen for heavy Vaginal bleeding.  She was taking the shot for [facility], and not having periods.  About a month ago, she started on the patch.  She had a light period, then about 5 days ago, she started extremely heaving bleeding where she is changing her pad about every hour and a half.  She has tried to get an appointment with her GYN, but has been unable to speak with anyone.  Please advise.

## 2017-03-12 NOTE — Telephone Encounter (Signed)
Called patient and left a VM to call office for further questions.

## 2017-03-13 ENCOUNTER — Encounter: Payer: Self-pay | Admitting: Obstetrics & Gynecology

## 2017-03-13 ENCOUNTER — Ambulatory Visit (INDEPENDENT_AMBULATORY_CARE_PROVIDER_SITE_OTHER): Payer: 59 | Admitting: Obstetrics & Gynecology

## 2017-03-13 VITALS — BP 121/83 | HR 77 | Resp 16 | Ht 65.0 in | Wt 172.0 lb

## 2017-03-13 DIAGNOSIS — N920 Excessive and frequent menstruation with regular cycle: Secondary | ICD-10-CM | POA: Diagnosis not present

## 2017-03-13 DIAGNOSIS — Z3202 Encounter for pregnancy test, result negative: Secondary | ICD-10-CM | POA: Diagnosis not present

## 2017-03-13 LAB — POCT URINE PREGNANCY: PREG TEST UR: NEGATIVE

## 2017-03-13 MED ORDER — MEGESTROL ACETATE 40 MG PO TABS: ORAL_TABLET | ORAL | 0 refills | Status: DC

## 2017-03-13 NOTE — Progress Notes (Signed)
   Subjective:    Patient ID: [name], female    DOB: [birth year], 26 y.o.   MRN: [id number]  HPI  26 year old female presents complaining of menorrhagia. Patient went off Depo-Provera and is only had 2 cycles since stopping that several months ago. The patient is on Ortho Evra approximately 3-1/2 weeks ago she is in her patch free week.  The bleeding has been so heavy that she soaks overnight pads an hour and a half. She has used towels on her bed. The patient started bleeding during the third week of the patch. The bleeding became much worse during patch free week. Patient denies any treatments other than Ortho Evra. Patient denies lightheaded dizziness or syncope.  Review of Systems  Constitutional: Negative.   Respiratory: Negative.   Cardiovascular: Negative.   Gastrointestinal: Negative.   Genitourinary: Positive for menstrual problem and vaginal bleeding.       Objective:   Physical Exam  Constitutional: She is oriented to person, place, and time. She appears well-developed and well-nourished. No distress.  HENT:  Head: Normocephalic and atraumatic.  Eyes: Conjunctivae are normal.  Pulmonary/Chest: Effort normal.  Abdominal: Soft. Bowel sounds are normal. She exhibits no distension and no mass. There is no tenderness. There is no rebound and no guarding.  Genitourinary: Vagina normal and uterus normal.  Genitourinary Comments: Blood in the vault.   Musculoskeletal: She exhibits no edema.  Neurological: She is alert and oriented to person, place, and time.  Skin: Skin is warm and dry.  Psychiatric: She has a normal mood and affect.  Vitals reviewed.  Vitals:   03/13/17 1610  BP: 121/83  Pulse: 77  Resp: 16  Weight: 172 lb (78 kg)  Height: 5\' 5"  (1.651 m)       Assessment & Plan:  26 year old female with menorrhagia on Ortho Evra  1-Megace 3 times a day for 5 days 2-upon Ortho Evra patch on the sixth day, next Monday. 3-3 continues patient will need  transvaginal ultrasound and office appointment. 4-TSH and CBC today. UPT is negative.

## 2017-03-14 LAB — CBC
HEMATOCRIT: 42.4 % (ref 35.0–45.0)
Hemoglobin: 13.9 g/dL (ref 11.7–15.5)
MCH: 29.1 pg (ref 27.0–33.0)
MCHC: 32.8 g/dL (ref 32.0–36.0)
MCV: 88.9 fL (ref 80.0–100.0)
MPV: 9.8 fL (ref 7.5–12.5)
PLATELETS: 301 10*3/uL (ref 140–400)
RBC: 4.77 MIL/uL (ref 3.80–5.10)
RDW: 13.1 % (ref 11.0–15.0)
WBC: 10.1 10*3/uL (ref 3.8–10.8)

## 2017-03-14 LAB — TSH: TSH: 0.67 mIU/L

## 2017-04-17 ENCOUNTER — Encounter: Payer: Self-pay | Admitting: Family Medicine

## 2017-04-17 ENCOUNTER — Ambulatory Visit (INDEPENDENT_AMBULATORY_CARE_PROVIDER_SITE_OTHER): Payer: 59 | Admitting: Family Medicine

## 2017-04-17 VITALS — BP 132/83 | HR 70 | Temp 98.1°F | Wt 170.0 lb

## 2017-04-17 DIAGNOSIS — B9789 Other viral agents as the cause of diseases classified elsewhere: Secondary | ICD-10-CM

## 2017-04-17 DIAGNOSIS — J069 Acute upper respiratory infection, unspecified: Secondary | ICD-10-CM

## 2017-04-17 MED ORDER — BENZONATATE 200 MG PO CAPS: 200.0000 mg | ORAL_CAPSULE | Freq: Three times a day (TID) | ORAL | 0 refills | Status: DC | PRN

## 2017-04-17 MED ORDER — GUAIFENESIN-CODEINE 100-10 MG/5ML PO SOLN: 5.0000 mL | Freq: Every evening | ORAL | 0 refills | Status: DC | PRN

## 2017-04-17 MED ORDER — AZITHROMYCIN 250 MG PO TABS: 250.0000 mg | ORAL_TABLET | Freq: Every day | ORAL | 0 refills | Status: DC

## 2017-04-17 MED ORDER — IPRATROPIUM BROMIDE 0.06 % NA SOLN: 2.0000 | NASAL | 6 refills | Status: DC | PRN

## 2017-04-17 NOTE — Patient Instructions (Signed)
Thank you for coming in today. Take over the counter cough and cold medicines.  Use the nasal spray.  Take tessalon pearles during the day for cough.  Use codeine cough medicine at night as needed for cough.  Use azithromycin antibiotic if worsening.   Call or go to the emergency room if you get worse, have trouble breathing, have chest pains, or palpitations.    Upper Respiratory Infection, Adult Most upper respiratory infections (URIs) are a viral infection of the air passages leading to the lungs. A URI affects the nose, throat, and upper air passages. The most common type of URI is nasopharyngitis and is typically referred to as "the common cold." URIs run their course and usually go away on their own. Most of the time, a URI does not require medical attention, but sometimes a bacterial infection in the upper airways can follow a viral infection. This is called a secondary infection. Sinus and middle ear infections are common types of secondary upper respiratory infections. Bacterial pneumonia can also complicate a URI. A URI can worsen asthma and chronic obstructive pulmonary disease (COPD). Sometimes, these complications can require emergency medical care and may be life threatening. What are the causes? Almost all URIs are caused by viruses. A virus is a type of germ and can spread from one person to another. What increases the risk? You may be at risk for a URI if:  You smoke.  You have chronic heart or lung disease.  You have a weakened defense (immune) system.  You are very young or very old.  You have nasal allergies or asthma.  You work in crowded or poorly ventilated areas.  You work in health care facilities or schools.  What are the signs or symptoms? Symptoms typically develop 2-3 days after you come in contact with a cold virus. Most viral URIs last 7-10 days. However, viral URIs from the influenza virus (flu virus) can last 14-18 days and are typically more severe.  Symptoms may include:  Runny or stuffy (congested) nose.  Sneezing.  Cough.  Sore throat.  Headache.  Fatigue.  Fever.  Loss of appetite.  Pain in your forehead, behind your eyes, and over your cheekbones (sinus pain).  Muscle aches.  How is this diagnosed? Your health care provider may diagnose a URI by:  Physical exam.  Tests to check that your symptoms are not due to another condition such as: ? Strep throat. ? Sinusitis. ? Pneumonia. ? Asthma.  How is this treated? A URI goes away on its own with time. It cannot be cured with medicines, but medicines may be prescribed or recommended to relieve symptoms. Medicines may help:  Reduce your fever.  Reduce your cough.  Relieve nasal congestion.  Follow these instructions at home:  Take medicines only as directed by your health care provider.  Gargle warm saltwater or take cough drops to comfort your throat as directed by your health care provider.  Use a warm mist humidifier or inhale steam from a shower to increase air moisture. This may make it easier to breathe.  Drink enough fluid to keep your urine clear or pale yellow.  Eat soups and other clear broths and maintain good nutrition.  Rest as needed.  Return to work when your temperature has returned to normal or as your health care provider advises. You may need to stay home longer to avoid infecting others. You can also use a face mask and careful hand washing to prevent spread of the  virus.  Increase the usage of your inhaler if you have asthma.  Do not use any tobacco products, including cigarettes, chewing tobacco, or electronic cigarettes. If you need help quitting, ask your health care provider. How is this prevented? The best way to protect yourself from getting a cold is to practice good hygiene.  Avoid oral or hand contact with people with cold symptoms.  Wash your hands often if contact occurs.  There is no clear evidence that vitamin C,  vitamin E, echinacea, or exercise reduces the chance of developing a cold. However, it is always recommended to get plenty of rest, exercise, and practice good nutrition. Contact a health care provider if:  You are getting worse rather than better.  Your symptoms are not controlled by medicine.  You have chills.  You have worsening shortness of breath.  You have brown or red mucus.  You have yellow or brown nasal discharge.  You have pain in your face, especially when you bend forward.  You have a fever.  You have swollen neck glands.  You have pain while swallowing.  You have white areas in the back of your throat. Get help right away if:  You have severe or persistent: ? Headache. ? Ear pain. ? Sinus pain. ? Chest pain.  You have chronic lung disease and any of the following: ? Wheezing. ? Prolonged cough. ? Coughing up blood. ? A change in your usual mucus.  You have a stiff neck.  You have changes in your: ? Vision. ? Hearing. ? Thinking. ? Mood. This information is not intended to replace advice given to you by your health care provider. Make sure you discuss any questions you have with your health care provider. Document Released: 04/25/2001 Document Revised: 07/02/2016 Document Reviewed: 02/04/2014 Elsevier Interactive Patient Education  2017 [employer].

## 2017-04-17 NOTE — Progress Notes (Signed)
1 

## 2017-04-17 NOTE — Progress Notes (Signed)
[name] is a 26 y.o. female who presents to [name] today for cough congestion and runny nose. Symptoms present for about a day. Cough is worsening and interfering with sleep. Patient is using over-the-counter medications which helped. She denies significant wheezing. No fevers chills vomiting or diarrhea.   Past Medical History:  Diagnosis Date  . Allergy   . Anxiety   . Depression    Past Surgical History:  Procedure Laterality Date  . CHOLECYSTECTOMY  08-2009   Social History  Substance Use Topics  . Smoking status: Former Smoker    Quit date: 11/14/2007  . Smokeless tobacco: Never Used  . Alcohol use Yes     Comment: once every coupld of month.    family history includes Colon cancer in her maternal grandfather; Depression in her brother and mother; Diabetes in her father; Hyperlipidemia in her father; Hypertension in her father; Lung cancer in her paternal aunt; Melanoma in her maternal grandfather; Prostate cancer in her maternal grandfather.  ROS as above:  Medications: Current Outpatient Prescriptions  Medication Sig Dispense Refill  . albuterol (PROVENTIL HFA;VENTOLIN HFA) 108 (90 BASE) MCG/ACT inhaler Inhale into the lungs every 6 (six) hours as needed for wheezing or shortness of breath.    . DiphenhydrAMINE HCl (ALLERGY MED PO) Take by mouth.    . megestrol (MEGACE) 40 MG tablet Take one tablet 3 times a day (about 8 hours apart) for 6 days 18 tablet 0  . norelgestromin-ethinyl estradiol (ORTHO EVRA) 150-35 MCG/24HR transdermal patch Place 1 patch onto the skin once a week. 3 patch 12  . Probiotic Product (PROBIOTIC DAILY PO) Take by mouth.    Marland Kitchen azithromycin (ZITHROMAX) 250 MG tablet Take 1 tablet (250 mg total) by mouth daily. Take first 2 tablets together, then 1 every day until finished. 6 tablet 0  . benzonatate (TESSALON) 200 MG  capsule Take 1 capsule (200 mg total) by mouth 3 (three) times daily as needed for cough. 45 capsule 0  . guaiFENesin-codeine 100-10 MG/5ML syrup Take 5 mLs by mouth at bedtime as needed for cough. 120 mL 0  . ipratropium (ATROVENT) 0.06 % nasal spray Place 2 sprays into both nostrils every 4 (four) hours as needed for rhinitis. 10 mL 6   No current facility-administered medications for this visit.    Allergies  Allergen Reactions  . Latex     Health Maintenance Health Maintenance  Topic Date Due  . INFLUENZA VACCINE  11/27/2017 (Originally 06/13/2017)  . PAP SMEAR  02/16/2020  . TETANUS/TDAP  02/15/2022  . HIV Screening  Completed     Exam:  BP 132/83   Pulse 70   Temp 98.1 F (36.7 C) (Oral)   Wt 170 lb (77.1 kg)   SpO2 98%   BMI 28.29 kg/m  Gen: Well NAD HEENT: EOMI,  MMM Clear nasal discharge. Posterior pharynx with cobblestoning normal tympanic membranes bilaterally no cervical lymphadenopathy. Lungs: Normal work of breathing. CTABL Heart: RRR no MRG Abd: NABS, Soft. Nondistended, Nontender Exts: Brisk capillary refill, warm and well perfused.    No results found for this or any previous visit (from the past 72 hour(s)). No results found.    Assessment and Plan: 26 y.o. female with viral URI. Symptomatic management with over-the-counter medications as well as Tessalon Perles codeine cough syrup and Atrovent nasal spray. Backup azithromycin for use if patient worsens. Return as needed.   No orders of the  defined types were placed in this encounter.  Meds ordered this encounter  Medications  . ipratropium (ATROVENT) 0.06 % nasal spray    Sig: Place 2 sprays into both nostrils every 4 (four) hours as needed for rhinitis.    Dispense:  10 mL    Refill:  6  . benzonatate (TESSALON) 200 MG capsule    Sig: Take 1 capsule (200 mg total) by mouth 3 (three) times daily as needed for cough.    Dispense:  45 capsule    Refill:  0  . guaiFENesin-codeine 100-10 MG/5ML  syrup    Sig: Take 5 mLs by mouth at bedtime as needed for cough.    Dispense:  120 mL    Refill:  0  . azithromycin (ZITHROMAX) 250 MG tablet    Sig: Take 1 tablet (250 mg total) by mouth daily. Take first 2 tablets together, then 1 every day until finished.    Dispense:  6 tablet    Refill:  0     Discussed warning signs or symptoms. Please see discharge instructions. Patient expresses understanding.

## 2017-05-01 ENCOUNTER — Encounter: Payer: Self-pay | Admitting: Emergency Medicine

## 2017-05-01 ENCOUNTER — Emergency Department
Admission: EM | Admit: 2017-05-01 | Discharge: 2017-05-01 | Disposition: A | Discharge status: Home / Self Care | Payer: 59 | Source: Home / Self Care | Attending: Family Medicine | Admitting: Family Medicine

## 2017-05-01 ENCOUNTER — Emergency Department (INDEPENDENT_AMBULATORY_CARE_PROVIDER_SITE_OTHER): Payer: 59

## 2017-05-01 DIAGNOSIS — J90 Pleural effusion, not elsewhere classified: Secondary | ICD-10-CM

## 2017-05-01 DIAGNOSIS — J189 Pneumonia, unspecified organism: Secondary | ICD-10-CM | POA: Diagnosis not present

## 2017-05-01 LAB — POCT CBC W AUTO DIFF (K'VILLE URGENT CARE)

## 2017-05-01 MED ORDER — LEVOFLOXACIN 750 MG PO TABS: 750.0000 mg | ORAL_TABLET | Freq: Every day | ORAL | 0 refills | Status: DC

## 2017-05-01 MED ORDER — GUAIFENESIN-CODEINE 100-10 MG/5ML PO SOLN: ORAL | 0 refills | Status: DC

## 2017-05-01 NOTE — Discharge Instructions (Signed)
Take plain guaifenesin (1200mg extended release tabs such as Mucinex) twice daily, with plenty of water, for cough and congestion.  Get adequate rest.   °Stop all antihistamines for now, and other non-prescription cough/cold preparations. °  ° °

## 2017-05-01 NOTE — ED Provider Notes (Signed)
[facility]    CSN: [id number] Arrival date & time: 05/01/17  1723     History   Chief Complaint Chief Complaint  Patient presents with  . Shoulder Pain    HPI [name] is a 26 y.o. female.   Yesterday at 11:30am while driving, patient experienced a sensation of tightness in her left chest.  The pain has persisted, is worse with deep inspiration and cough, radiates to her left upper arm, and is worse when supine.  She denies fevers, chills, and sweats.  No lower leg swelling or pain. She states that she had a URI about 2 weeks ago and has felt well, but still has mild cough.      Past Medical History:  Diagnosis Date  . Allergy   . Anxiety   . Depression     Patient Active Problem List   Diagnosis Date Noted  . Metatarsal stress fracture of right foot 05/18/2016  . GAD (generalized anxiety disorder) 10/07/2014  . ALLERGIC RHINITIS CAUSE UNSPECIFIED 04/07/2008    Past Surgical History:  Procedure Laterality Date  . CHOLECYSTECTOMY  08-2009    OB History    Gravida Para Term Preterm AB Living   1 1 1     1    SAB TAB Ectopic Multiple Live Births                   Home Medications    Prior to Admission medications   Medication Sig Start Date End Date Taking? Authorizing Provider  albuterol (PROVENTIL HFA;VENTOLIN HFA) 108 (90 BASE) MCG/ACT inhaler Inhale into the lungs every 6 (six) hours as needed for wheezing or shortness of breath.    [provider]  DiphenhydrAMINE HCl (ALLERGY MED PO) Take by mouth.    [provider]  guaiFENesin-codeine 100-10 MG/5ML syrup Take 57mL by mouth at bedtime as needed for cough 05/01/17   05/03/17, MD  ipratropium (ATROVENT) 0.06 % nasal spray Place 2 sprays into both nostrils every 4 (four) hours as needed for rhinitis. 04/17/17   06/17/17, MD  levofloxacin (LEVAQUIN) 750 MG tablet Take 1 tablet (750 mg total) by mouth daily. 05/01/17   05/03/17, MD  megestrol  (MEGACE) 40 MG tablet Take one tablet 3 times a day (about 8 hours apart) for 6 days 03/13/17   05/13/17, MD  norelgestromin-ethinyl estradiol (ORTHO EVRA) 150-35 MCG/24HR transdermal patch Place 1 patch onto the skin once a week. 02/15/17   04/17/17, MD  Probiotic Product (PROBIOTIC DAILY PO) Take by mouth.    [provider]    Family History Family History  Problem Relation Age of Onset  . Depression Mother   . Diabetes Father   . Hypertension Father   . Hyperlipidemia Father   . Depression Brother   . Colon cancer Maternal Grandfather   . Prostate cancer Maternal Grandfather   . Melanoma Maternal Grandfather   . Lung cancer Paternal Aunt        smoker     Social History Social History  Substance Use Topics  . Smoking status: Former Smoker    Quit date: 11/14/2007  . Smokeless tobacco: Never Used  . Alcohol use Yes     Comment: once every couple of months.      Allergies   Latex   Review of Systems Review of Systems No sore throat + cough + pleuritic pain No wheezing No nasal congestion No post-nasal drainage  No sinus pain/pressure No itchy/red eyes No earache No hemoptysis No SOB No fever/chills No nausea No vomiting No abdominal pain No diarrhea No urinary symptoms No skin rash + fatigue No myalgias No headache     Physical Exam Triage Vital Signs ED Triage Vitals  Enc Vitals Group     BP 05/01/17 1746 125/85     Pulse Rate 05/01/17 1746 84     Resp --      Temp 05/01/17 1746 97.8 F (36.6 C)     Temp Source 05/01/17 1746 Oral     SpO2 05/01/17 1746 99 %     Weight 05/01/17 1746 172 lb (78 kg)     Height --      Head Circumference --      Peak Flow --      Pain Score 05/01/17 1747 5     Pain Loc --      Pain Edu? --      Excl. in GC? --    No data found.   Updated Vital Signs BP 125/85 (BP Location: Right Arm)   Pulse 84   Temp 97.8 F (36.6 C) (Oral)   Wt 172 lb (78 kg)   LMP 04/13/2017 (Approximate)   SpO2  99%   BMI 28.62 kg/m   Visual Acuity Right Eye Distance:   Left Eye Distance:   Bilateral Distance:    Right Eye Near:   Left Eye Near:    Bilateral Near:     Physical Exam  Constitutional: She appears well-developed and well-nourished. No distress.  HENT:  Head: Normocephalic.  Right Ear: Tympanic membrane, external ear and ear canal normal.  Left Ear: Tympanic membrane, external ear and ear canal normal.  Mouth/Throat: Oropharynx is clear and moist.  Eyes: Conjunctivae and EOM are normal. Pupils are equal, round, and reactive to light.  Neck: Neck supple.  Cardiovascular: Normal heart sounds.   Pulmonary/Chest: Breath sounds normal. No respiratory distress.        Patient experiences pain in chest as noted on diagram. No tenderness to palpation in this area.  Abdominal: There is no tenderness.  Musculoskeletal: She exhibits no edema or tenderness.  Lymphadenopathy:    She has no cervical adenopathy.  Neurological: She is alert.  Skin: Skin is warm and dry.  Nursing note and vitals reviewed.    UC Treatments / Results  Labs (all labs ordered are listed, but only abnormal results are displayed) Labs Reviewed  CBC POCT:  WBC 13.9; LY 15.2; MO 8.2; GR 76.6; Hgb 13.5; Platelets 287     EKG  EKG Interpretation  Rate:  87 BPM PR:  140 msec QT:  358 msec QTcH:  430 msec QRSD:  80 msec QRS axis:  59 degrees Interpretation:   Normal sinus rhythm; left atrial enlargement; no acute changes       Radiology Dg Chest 2 View  Result Date: 05/01/2017 CLINICAL DATA:  26 year old female with left-sided chest pain. EXAM: CHEST  2 VIEW COMPARISON:  Chest radiograph dated 12/07/2016 FINDINGS: There is a small left pleural effusion, new from prior study. There is associated subsegmental atelectasis of the left lung base. Pneumonia is not excluded. The right lung is clear. There is no pneumothorax. The cardiac silhouette is within normal limits. No acute osseous pathology.  Right upper quadrant cholecystectomy clips. IMPRESSION: Small left pleural effusion with associated left lung base atelectasis. Infiltrate is not excluded. Clinical correlation is recommended. Electronically Signed   By: Ceasar Mons.D.  On: 05/01/2017 18:41    Procedures Procedures (including critical care time)  Medications Ordered in UC Medications - No data to display   Initial Impression / Assessment and Plan / UC Course  I have reviewed the triage vital signs and the nursing notes.  Pertinent labs & imaging results that were available during my care of the patient were reviewed by me and considered in my medical decision making (see chart for details).    Begin Levaquin 750mg  daily for one week. Rx for Robitussin AC for night time cough.  Take plain guaifenesin (1200mg  extended release tabs such as Mucinex) twice daily, with plenty of water, for cough and congestion. Get adequate rest.     Stop all antihistamines for now, and other non-prescription cough/cold preparations. Followup with Family Doctor in one week.    Final Clinical Impressions(s) / UC Diagnoses   Final diagnoses:  Lingular pneumonia    New Prescriptions New Prescriptions   GUAIFENESIN-CODEINE 100-10 MG/5ML SYRUP    Take 43mL by mouth at bedtime as needed for cough   LEVOFLOXACIN (LEVAQUIN) 750 MG TABLET    Take 1 tablet (750 mg total) by mouth daily.     , MD 05/05/17 2225

## 2017-05-01 NOTE — ED Triage Notes (Signed)
Pt c/o left shoulder and side pain that is worse with deep breath. Started yesterday. No relief with aleve.

## 2017-05-02 DIAGNOSIS — I2699 Other pulmonary embolism without acute cor pulmonale: Secondary | ICD-10-CM | POA: Insufficient documentation

## 2017-05-02 DIAGNOSIS — J189 Pneumonia, unspecified organism: Secondary | ICD-10-CM | POA: Insufficient documentation

## 2017-05-02 HISTORY — DX: Other pulmonary embolism without acute cor pulmonale: I26.99

## 2017-05-03 ENCOUNTER — Telehealth: Payer: Self-pay | Admitting: *Deleted

## 2017-05-03 NOTE — Telephone Encounter (Signed)
error 

## 2017-05-07 ENCOUNTER — Telehealth: Payer: Self-pay | Admitting: Family Medicine

## 2017-05-07 NOTE — Telephone Encounter (Signed)
Pt went to [facility] ED and was diagnosed with a pulmonary embolism and pneumonia. She was started on Xarelto. She now reports she is on her menstrual cycle and is having large clots. She also is having to change her pad every hour. Questions if there is something she can do to lighten this up. Pt is set to follow up with PCP on 05/14/17.

## 2017-05-08 NOTE — Telephone Encounter (Signed)
Unfortunately there isn't a lot that we can do. Most of the medications that we would use to slow down her menstrual cycle would be contraindicated because of the blood clot. Just make sure hydrating well. And let us know if it seems to not be slowing down by the end of the week.

## 2017-05-08 NOTE — Telephone Encounter (Signed)
Attempted to contact Pt, no answer and VM is full.  

## 2017-05-09 ENCOUNTER — Telehealth: Payer: Self-pay | Admitting: Family Medicine

## 2017-05-09 NOTE — Telephone Encounter (Signed)
Pt advised lab values and imaging reports are available for review on Care Everywhere. She will go over this with PCP at hospital follow up next week.

## 2017-05-09 NOTE — Telephone Encounter (Signed)
Attempted to contact Pt again. No answer and VM is full.

## 2017-05-09 NOTE — Telephone Encounter (Signed)
Pt advised. She will go over in more detail with PCP at upcoming hospital follow up. Pt states she was passing large clots prior to blood thinners too, which she feels is abnormal. Offered referral to OB/GYN. She is followed by OB/GYN in our building. She has placed a call with them as well.

## 2017-05-09 NOTE — Telephone Encounter (Signed)
Pt called. She was told by hospital that her test results have been forwarded to her PCP and she wants to know what they are.  Thank you.

## 2017-05-14 ENCOUNTER — Encounter: Payer: Self-pay | Admitting: Family Medicine

## 2017-05-14 ENCOUNTER — Ambulatory Visit (INDEPENDENT_AMBULATORY_CARE_PROVIDER_SITE_OTHER): Payer: 59 | Admitting: Family Medicine

## 2017-05-14 ENCOUNTER — Other Ambulatory Visit: Payer: Self-pay | Admitting: Family Medicine

## 2017-05-14 VITALS — BP 129/73 | HR 79 | Resp 16 | Wt 170.6 lb

## 2017-05-14 DIAGNOSIS — N920 Excessive and frequent menstruation with regular cycle: Secondary | ICD-10-CM

## 2017-05-14 DIAGNOSIS — J189 Pneumonia, unspecified organism: Secondary | ICD-10-CM | POA: Diagnosis not present

## 2017-05-14 DIAGNOSIS — I2699 Other pulmonary embolism without acute cor pulmonale: Secondary | ICD-10-CM

## 2017-05-14 LAB — CBC WITH DIFFERENTIAL/PLATELET
BASOS ABS: 0 {cells}/uL (ref 0–200)
Basophils Relative: 0 %
EOS ABS: 309 {cells}/uL (ref 15–500)
EOS PCT: 3 %
HCT: 39.7 % (ref 35.0–45.0)
Hemoglobin: 12.8 g/dL (ref 11.7–15.5)
LYMPHS PCT: 40 %
Lymphs Abs: 4120 cells/uL — ABNORMAL HIGH (ref 850–3900)
MCH: 28.6 pg (ref 27.0–33.0)
MCHC: 32.2 g/dL (ref 32.0–36.0)
MCV: 88.6 fL (ref 80.0–100.0)
MONOS PCT: 5 %
MPV: 9.1 fL (ref 7.5–12.5)
Monocytes Absolute: 515 cells/uL (ref 200–950)
NEUTROS ABS: 5356 {cells}/uL (ref 1500–7800)
NEUTROS PCT: 52 %
PLATELETS: 410 10*3/uL — AB (ref 140–400)
RBC: 4.48 MIL/uL (ref 3.80–5.10)
RDW: 12.8 % (ref 11.0–15.0)
WBC: 10.3 10*3/uL (ref 3.8–10.8)

## 2017-05-14 NOTE — Progress Notes (Signed)
Subjective:    Patient ID: [name], female    DOB: 24-Aug-1991, 26 y.o.   MRN: [id number]  HPI  Twice 32-year-old female comes in today to follow-up for recent hospital visit.  She has a history of seasonal allergies and childhood asthma. Check she went to local urgent care and was diagnosed with pneumonia via a chest x-ray and placed on Levaquin. 2 days later her pain on the left side of her chest started to get worse and was worse with inspiration and movement. She went to no [facility] emergency department for further evaluation. She had recently been on estrogen patch to help with painful periods. She was diagnosed with pulmonary embolism. Started on Xarelto. The estrogen patch was discontinued. And treatment was continued for pneumonia.She did complete her antibiotics. Fevers chills or sweats.  See imaging note below:  Imaging:  Ct Angio Pulmonary Result Date: 05/02/2017 INDICATION: L sided chest pain, SHOB, tachycardia TECHNIQUE: CT ANGIO PULMONARY 80 mL of Isovue 370 was administered intravenously. Multiplanar 2D & angiographic 3D MIP images constructed/reviewed. FINDINGS: Positive for filling defect (allowing for slight patient motion artifact) consistent with small PE in the segmental branches of the lingula with the downstream lung parenchyma demonstrating infiltration, likely an infarct. Similar infiltrative changes in the basilar aspect of the left lower lobe without associated PE. These findings may represent atelectasis/pneumonia. Small free layering left pleural effusion. NO other PE bilaterally. NO neoplastic lung mass. NO pneumothorax. NO right effusion. NO acute abnormality in the mediastinum or axillae (no mass, hematoma, or adenopathy). NO acute osseous lesion.   IMPRESSION: Positive for small lingular PE. Infiltrative changes left lung base, likely atelectasis/pneumonia, possibly with pulmonary infarct (lingula ). Small left effusion  Overall she is doing much better.  Thepain on the left side of her chest is improving. She just feels tired and still getting short of breath with activity. She's not having any bleeding problems and is tolerating this Xarelto well.  She also went to discuss heavy menstrual periods area this actually started before she had the pulmonary embolism. In fact that's why she was on the Ortho Evra patch was to help get better control of her cycles. Often she passes clots and they can last up to 14 days. She said recently she had a period after starting on the Xarelto I was extremely heavy. She said she passed 21 clots that day.  Review of Systems   BP 129/73   Pulse 79   Resp 16   Wt 170 lb 9.6 oz (77.4 kg)   SpO2 100%   BMI 28.39 kg/m     Allergies  Allergen Reactions  . Estrogens Other (See Comments)    Hx of PE on estrogen  . Latex     Past Medical History:  Diagnosis Date  . Allergy   . Anxiety   . Depression     Past Surgical History:  Procedure Laterality Date  . CHOLECYSTECTOMY  08-2009    Social History   Social History  . Marital status: Single    Spouse name: N/A  . Number of children: 1  . Years of education: N/A   Occupational History  . 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 Smoking status: Former Smoker    Quit date: 11/14/2007  . Smokeless tobacco: Never Used  . Alcohol use Yes     Comment: once every couple of months.   Marland Kitchen  Drug use: No  . Sexual activity: Yes    Partners: Male    Birth control/ protection: Patch     Comment:  HS diploma, 5 caffeine drinks daily,no regular exercise.   Other Topics Concern  . Not on file   Social History Narrative   Exercise 3-4 days per week. 1-2 cups  Caffeine per day. .      Family History  Problem Relation Age of Onset  . Depression Mother   . Diabetes Father   . Hypertension Father   . Hyperlipidemia Father   . Depression Brother   . Colon cancer Maternal Grandfather   . Prostate cancer Maternal  Grandfather   . Melanoma Maternal Grandfather   . Lung cancer Paternal Aunt        smoker     Outpatient Encounter Prescriptions as of 05/14/2017  Medication Sig  . albuterol (PROVENTIL HFA;VENTOLIN HFA) 108 (90 BASE) MCG/ACT inhaler Inhale into the lungs every 6 (six) hours as needed for wheezing or shortness of breath.  . megestrol (MEGACE) 40 MG tablet Take one tablet 3 times a day (about 8 hours apart) for 6 days  . Probiotic Product (PROBIOTIC DAILY PO) Take by mouth.  . Rivaroxaban 15 & 20 MG TBPK Take by mouth.  . [DISCONTINUED] DiphenhydrAMINE HCl (ALLERGY MED PO) Take by mouth.  . [DISCONTINUED] guaiFENesin-codeine 100-10 MG/5ML syrup Take 1mL by mouth at bedtime as needed for cough  . [DISCONTINUED] ipratropium (ATROVENT) 0.06 % nasal spray Place 2 sprays into both nostrils every 4 (four) hours as needed for rhinitis.  . [DISCONTINUED] levofloxacin (LEVAQUIN) 750 MG tablet Take 1 tablet (750 mg total) by mouth daily.  . [DISCONTINUED] norelgestromin-ethinyl estradiol (ORTHO EVRA) 150-35 MCG/24HR transdermal patch Place 1 patch onto the skin once a week.   No facility-administered encounter medications on file as of 05/14/2017.           Objective:   Physical Exam  Constitutional: She is oriented to person, place, and time. She appears well-developed and well-nourished.  HENT:  Head: Normocephalic and atraumatic.  Cardiovascular: Normal rate, regular rhythm and normal heart sounds.   Pulmonary/Chest: Effort normal and breath sounds normal.  Neurological: She is alert and oriented to person, place, and time.  Skin: Skin is warm and dry.  Psychiatric: She has a normal mood and affect. Her behavior is normal.          Assessment & Plan:  Pulmonary embolism-Did give her a copy of her hypercoagulable panel. Will refer her to hematology for further evaluation. For now plan is to continue her Xarelto for a total of 3 months.  Heavy menstrual periods-check CBC today. She  might want to consider going back on Depo-Provera injection every 3 months. Or considering an IUD that is progesterone only.Evaluate for anemia.   Pneumonia of the left lung-resolved. She completed her antibiotics.

## 2017-05-15 ENCOUNTER — Encounter: Payer: Self-pay | Admitting: Family Medicine

## 2017-05-15 DIAGNOSIS — Z0189 Encounter for other specified special examinations: Secondary | ICD-10-CM

## 2017-05-15 NOTE — Addendum Note (Signed)
Addended by: Nani Gasser D on: 05/15/2017 08:01 PM   Modules accepted: Orders

## 2017-05-24 ENCOUNTER — Encounter: Payer: Self-pay | Admitting: Family Medicine

## 2017-05-24 LAB — HYPERCOAGULABLE PANEL, COMPREHENSIVE
ANTITHROMB III FUNC: 60
Factor II, DNA Analysis: NEGATIVE
Factor V Mutation (Leiden)-NCBH: NEGATIVE
PROTEIN S AG TOTAL: 47
Protein S Activity: 57

## 2017-05-30 ENCOUNTER — Ambulatory Visit (HOSPITAL_BASED_OUTPATIENT_CLINIC_OR_DEPARTMENT_OTHER): Payer: 59

## 2017-05-30 ENCOUNTER — Ambulatory Visit (HOSPITAL_BASED_OUTPATIENT_CLINIC_OR_DEPARTMENT_OTHER): Payer: 59 | Admitting: Hematology & Oncology

## 2017-05-30 ENCOUNTER — Other Ambulatory Visit (HOSPITAL_BASED_OUTPATIENT_CLINIC_OR_DEPARTMENT_OTHER): Payer: 59

## 2017-05-30 VITALS — BP 117/74 | HR 75 | Temp 98.3°F | Resp 18 | Wt 170.0 lb

## 2017-05-30 DIAGNOSIS — Z801 Family history of malignant neoplasm of trachea, bronchus and lung: Secondary | ICD-10-CM

## 2017-05-30 DIAGNOSIS — Z87891 Personal history of nicotine dependence: Secondary | ICD-10-CM | POA: Diagnosis not present

## 2017-05-30 DIAGNOSIS — Z807 Family history of other malignant neoplasms of lymphoid, hematopoietic and related tissues: Secondary | ICD-10-CM | POA: Diagnosis not present

## 2017-05-30 DIAGNOSIS — Z832 Family history of diseases of the blood and blood-forming organs and certain disorders involving the immune mechanism: Secondary | ICD-10-CM | POA: Diagnosis not present

## 2017-05-30 DIAGNOSIS — I2699 Other pulmonary embolism without acute cor pulmonale: Secondary | ICD-10-CM

## 2017-05-30 DIAGNOSIS — Z800 Family history of malignant neoplasm of digestive organs: Secondary | ICD-10-CM | POA: Diagnosis not present

## 2017-05-30 LAB — CBC WITH DIFFERENTIAL (CANCER CENTER ONLY)
BASO#: 0 10*3/uL (ref 0.0–0.2)
BASO%: 0.1 % (ref 0.0–2.0)
EOS ABS: 0.2 10*3/uL (ref 0.0–0.5)
EOS%: 2.3 % (ref 0.0–7.0)
HCT: 40.9 % (ref 34.8–46.6)
HGB: 13.6 g/dL (ref 11.6–15.9)
LYMPH#: 3.3 10*3/uL (ref 0.9–3.3)
LYMPH%: 35.3 % (ref 14.0–48.0)
MCH: 30.2 pg (ref 26.0–34.0)
MCHC: 33.3 g/dL (ref 32.0–36.0)
MCV: 91 fL (ref 81–101)
MONO#: 0.4 10*3/uL (ref 0.1–0.9)
MONO%: 4.5 % (ref 0.0–13.0)
NEUT#: 5.5 10*3/uL (ref 1.5–6.5)
NEUT%: 57.8 % (ref 39.6–80.0)
PLATELETS: 250 10*3/uL (ref 145–400)
RBC: 4.5 10*6/uL (ref 3.70–5.32)
RDW: 12.9 % (ref 11.1–15.7)
WBC: 9.5 10*3/uL (ref 3.9–10.0)

## 2017-05-30 LAB — CMP (CANCER CENTER ONLY)
ALT(SGPT): 39 U/L (ref 10–47)
AST: 31 U/L (ref 11–38)
Albumin: 3.7 g/dL (ref 3.3–5.5)
Alkaline Phosphatase: 64 U/L (ref 26–84)
BUN, Bld: 8 mg/dL (ref 7–22)
CHLORIDE: 101 meq/L (ref 98–108)
CO2: 29 meq/L (ref 18–33)
CREATININE: 0.8 mg/dL (ref 0.6–1.2)
Calcium: 9 mg/dL (ref 8.0–10.3)
GLUCOSE: 92 mg/dL (ref 73–118)
POTASSIUM: 3.3 meq/L (ref 3.3–4.7)
SODIUM: 137 meq/L (ref 128–145)
Total Bilirubin: 0.7 mg/dl (ref 0.20–1.60)
Total Protein: 7.8 g/dL (ref 6.4–8.1)

## 2017-05-30 NOTE — Progress Notes (Signed)
Referral MD  Reason for Referral: Pulmonary embolism in the segmental branches of lingula artery   Chief Complaint  Patient presents with  . New Patient (Initial Visit)  : I had a blood clot in my left lung.  HPI: [name] is a very nice 26 year old white female. She has a 41-year-old daughter. She had no problems with the pregnancy.  She had been on estrogens for about 3 months. She then began to have some chest wall pain. She went to the urgent care. She had a chest x-ray. This was interpreted as pneumonia. She was put on some oral antibiotics.  Unfortunately, the chest wall pain persisted. She went back to the emergency room. She had a CT angiogram done. She may have had a little bit of shortness of breath.  The CT angiogram showed that she had a small pulmonary embolism in the lingula. It is felt that there may have been a pulmonary infarct. She had an echocardiogram. This was normal.  She had a Doppler of her legs. This also was negative for thromboembolic disease.  She was admitted for a couple days. She was placed on Xarelto.  This was all on June 20.  She is currently on Xarelto.  She hadsome lab work done. From what I can tell, this showed that her Protein S level was low. Her Protein S total level was 47%. She also had a low anti-thrombin antigen. I'm not sure exactly what this represents. Her anti-thrombin III was normal.  She is feeling better. She still has a little bit of discomfort over on the left lateral chest wall. There is she's had no hemoptysis. She has had heavy monthly cycles. She is seeing her gynecologist for this.  Of note there is a history of blood clots in her family. Both sides of her family have had blood clots. Her if she does not smoke. She did not take any long trips prior to this event.  She is not diabetic.  Again, she had a pregnancy when she was 26 years old. She got through this without any difficulties.  She has had gallbladder surgery. She  had this when she was 26 years old. This was unremarkable.  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  She's had no weight loss or weight gain issues. She does not have any thyroid issues.  There is no rashes. She's had no leg swelling. She's had no headache. She's had no change in bowel or bladder habits.  Overall, I would say that her performance status is ECOG 1.    Past Medical History:  Diagnosis Date  . Allergy   . Anxiety   . Depression   :  Past Surgical History:  Procedure Laterality Date  . CHOLECYSTECTOMY  08-2009  :   Current Outpatient Prescriptions:  .  albuterol (PROVENTIL HFA;VENTOLIN HFA) 108 (90 BASE) MCG/ACT inhaler, Inhale into the lungs every 6 (six) hours as needed for wheezing or shortness of breath., Disp: , Rfl:  .  Probiotic Product (PROBIOTIC DAILY PO), Take by mouth., Disp: , Rfl:  .  Rivaroxaban 15 & 20 MG TBPK, Take by mouth., Disp: , Rfl: :  :  Allergies  Allergen Reactions  . Estrogens Other (See Comments)    Hx of PE on estrogen  . Latex   :  Family History  Problem Relation Age of Onset  . Depression Mother   . Diabetes Father   . Hypertension Father   . Hyperlipidemia Father   .  Depression Brother   . Colon cancer Maternal Grandfather   . Prostate cancer Maternal Grandfather   . Melanoma Maternal Grandfather   . Lung cancer Paternal Aunt        smoker   :  Social History   Social History  . Marital status: Single    Spouse name: N/A  . Number of children: 1  . Years of education: N/A   Occupational History  . 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 Smoking status: Former Smoker    Quit date: 11/14/2007  . Smokeless tobacco: Never Used  . Alcohol use Yes     Comment: once every couple of months.   . Drug use: No  . Sexual activity: Yes    Partners: Male    Birth control/ protection: Patch     Comment:  HS diploma, 5 caffeine drinks daily,no regular exercise.   Other  Topics Concern  . Not on file   Social History Narrative   Exercise 3-4 days per week. 1-2 cups  Caffeine per day. .    :  Pertinent items are noted in HPI.  Exam:Well-developed and well-nourished white female in no obvious distress. Vital signs show a temperature of 98.3. Pulse 75. Blood pressure 117/74. Weight is 170 pounds. Head and neck exam shows no ocular or oral lesions. She has no palpable cervical or supraclavicular lymph nodes. Lungs are clear bilaterally. No friction rubs are noted. Cardiac exam regular rate and rhythm with no murmurs, rubs or bruits. Abdomen is soft. She has good bowel sounds. There is no fluid wave. There is no palpable liver or spleen tip. She has well-healed laparoscopy scars from her cholecystectomy. Back exam shows no tenderness over the spine, ribs or hips. Extremities shows no clubbing, cyanosis or edema. There is no palpable venous cord in her legs. She is a negative Homans sign bilaterally. Neurological exam shows no focal neurological deficit. Skin exam shows no rashes, ecchymoses or petechia. Shows very fair skin.    Recent Labs  05/30/17 1441  WBC 9.5  HGB 13.6  HCT 40.9  PLT 250    Recent Labs  05/30/17 1441  NA 137  K 3.3  CL 101  CO2 29  GLUCOSE 92  BUN 8  CREATININE 0.8  CALCIUM 9.0    Blood smear review:  None  Pathology: None     Assessment and Plan:  [name] is a very charming 26 year old white female. She has a pulmonary embolism. 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 We will have to repeat this.  It is plausible that she has this as there is a family history of thromboembolic disease.  We have her on Xarelto. I would like to repeat her CT angiogram in a couple months. This way, we can see if this embolic event has resolved.  I would keep her on full dose Xarelto for one year. I would then have her on maintenance Xarelto at 10 mg a day for 1 year.  If she  clearly does have a thrombophilic condition, her daughter will have to be checked when she is of age.  If she does have another child, she will need therapeutic anticoagulation. We probably would have to use Lovenox.  She is very nice. I spent about 45 minutes with her. I answered all of her questions.  We will get a CT angiogram when  she comes back to see [place] in September. I would like to think that the pulmonary emboli will have resolved at that point.

## 2017-05-31 LAB — ANTITHROMBIN III: ANTITHROMBIN ACTIVITY: 109 % (ref 75–135)

## 2017-05-31 LAB — D-DIMER, QUANTITATIVE: D-DIMER: <0.2 mg/L FEU (ref 0.00–0.49)

## 2017-06-01 LAB — LUPUS ANTICOAGULANT PANEL
DRVVT: 67.1 s — AB (ref 0.0–47.0)
PTT-LA: 34.3 s (ref 0.0–51.9)
dRVVT Confirm: 1.3 ratio — ABNORMAL HIGH (ref 0.8–1.2)
dRVVT Mix: 54.4 s — ABNORMAL HIGH (ref 0.0–47.0)

## 2017-06-02 LAB — CARDIOLIPIN ANTIBODIES, IGG, IGM, IGA
Anticardiolipin Ab,IgG,Qn: <9 GPL U/mL (ref 0–14)
Anticardiolipin Ab,IgM,Qn: 10 MPL U/mL (ref 0–12)

## 2017-06-04 LAB — FACTOR 5 LEIDEN

## 2017-06-05 LAB — PROTHROMBIN GENE MUTATION

## 2017-06-18 ENCOUNTER — Ambulatory Visit: Payer: 59 | Admitting: Obstetrics & Gynecology

## 2017-06-25 ENCOUNTER — Ambulatory Visit (INDEPENDENT_AMBULATORY_CARE_PROVIDER_SITE_OTHER): Payer: 59 | Admitting: Obstetrics & Gynecology

## 2017-06-25 ENCOUNTER — Encounter: Payer: Self-pay | Admitting: Obstetrics & Gynecology

## 2017-06-25 VITALS — BP 120/86 | HR 75 | Ht 65.0 in | Wt 170.0 lb

## 2017-06-25 DIAGNOSIS — Z30013 Encounter for initial prescription of injectable contraceptive: Secondary | ICD-10-CM | POA: Diagnosis not present

## 2017-06-25 DIAGNOSIS — Z3202 Encounter for pregnancy test, result negative: Secondary | ICD-10-CM | POA: Diagnosis not present

## 2017-06-25 LAB — POCT URINE PREGNANCY: PREG TEST UR: NEGATIVE

## 2017-06-25 MED ORDER — MEDROXYPROGESTERONE ACETATE 150 MG/ML IM SUSP: 150.0000 mg | INTRAMUSCULAR | 3 refills | Status: DC

## 2017-06-25 NOTE — Addendum Note (Signed)
Addended by: Anell Barr on: 06/25/2017 04:24 PM   Modules accepted: Orders

## 2017-06-25 NOTE — Progress Notes (Signed)
   Subjective:    Patient ID: [name], female    DOB: 02/19/1991, 26 y.o.   MRN: [id number]  HPI 26 yo S W P38 (2 yo daughter) here to discuss contraceptive options. She used depo provera for about and liked not having periods but not the weight gain. She used the Mirena for 5 years but didn't like "not knowing if it was dislodged". Then tried Ortho Evra. She liked it but then developed a PE. She is on zerelto. She is currently using condoms. She would llike to use depo provera again.   Review of Systems     Objective:   Physical Exam        Assessment & Plan:  Contraception- depo provera with NMP or after 2 weeks of abstinence

## 2017-07-09 ENCOUNTER — Ambulatory Visit (INDEPENDENT_AMBULATORY_CARE_PROVIDER_SITE_OTHER): Payer: 59 | Admitting: *Deleted

## 2017-07-09 DIAGNOSIS — Z3042 Encounter for surveillance of injectable contraceptive: Secondary | ICD-10-CM

## 2017-07-09 MED ORDER — MEDROXYPROGESTERONE ACETATE 150 MG/ML IM SUSP
150.0000 mg | INTRAMUSCULAR | Status: DC
  Administered 2017-07-09: 150 mg via INTRAMUSCULAR

## 2017-07-17 ENCOUNTER — Ambulatory Visit (INDEPENDENT_AMBULATORY_CARE_PROVIDER_SITE_OTHER): Payer: 59 | Admitting: Family Medicine

## 2017-07-17 ENCOUNTER — Encounter: Payer: Self-pay | Admitting: Family Medicine

## 2017-07-17 VITALS — BP 120/65 | HR 91 | Wt 167.0 lb

## 2017-07-17 DIAGNOSIS — I2699 Other pulmonary embolism without acute cor pulmonale: Secondary | ICD-10-CM

## 2017-07-17 DIAGNOSIS — Z131 Encounter for screening for diabetes mellitus: Secondary | ICD-10-CM

## 2017-07-17 DIAGNOSIS — Z13220 Encounter for screening for lipoid disorders: Secondary | ICD-10-CM

## 2017-07-17 MED ORDER — RIVAROXABAN 20 MG PO TABS: 20.0000 mg | ORAL_TABLET | Freq: Every day | ORAL | 10 refills | Status: DC

## 2017-07-17 NOTE — Progress Notes (Signed)
Subjective:    Patient ID: [name], female    DOB: 28-Aug-1991, 26 y.o.   MRN: [id number]  HPI Two-month follow-up for recent pulmonary embolism. She originally started expressing some shortness breath and went to local urgent care she was sinus with pneumonia. Her chest symptoms worsen so she went to the emergency department where she actually had a CT which indicated a pulmonary embolism. When I saw her she had some hypercoagulable panel workup showing possible protein S deficiency. After oncology consultation they have decided to investigate this a little further.They are planning on possibly keeping her on her Xarelto for 1 year and then decreasing her down to 10 mg daily at that point. They did want to repeat CT angiogram in a couple of months to confirm that the embolus has resolved. He actually ran out of her Xarelto over the weekend.  Overall she is doing well. She denies any shortness of breath. She feels like she is back to he baseline exercise tolerance. She does occasionally get some left-en go away. She is scheduled for repeat CT next week. She does need a refill on her Xarelto. She has not had any more excessive menstrual bleeding. They decided start her on the Depo-Provera injection. She had been on previously and gained a lot of about weight. But says she plans on just trying to work hard to keep her weight under control she will likely be on the Xarelto for the next 2 years. No period for the last 2 months.   She does have a form for work that needs to be completed which includes a lipid panel and hemoglobin A1c.  Review of Systems  BP 120/65   Pulse 91   Wt 167 lb (75.8 kg)   SpO2 100%   BMI 27.79 kg/m     Allergies  Allergen Reactions  . Estrogens Other (See Comments)    Hx of PE on estrogen  . Latex     Past Medical History:  Diagnosis Date  . Allergy   . Anxiety   . Depression     Past Surgical History:  Procedure Laterality Date  . CHOLECYSTECTOMY   08-2009    Social History   Social History  . Marital status: Single    Spouse name: N/A  . Number of children: 1  . Years of education: N/A   Occupational History  . 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 Smoking status: Former Smoker    Quit date: 11/14/2007  . Smokeless tobacco: Never Used  . Alcohol use Yes     Comment: once every couple of months.   . Drug use: No  . Sexual activity: Yes    Partners: Male    Birth control/ protection: Patch     Comment:  HS diploma, 5 caffeine drinks daily,no regular exercise.   Other Topics Concern  . Not on file   Social History Narrative   Exercise 3-4 days per week. 1-2 cups  Caffeine per day. .      Family History  Problem Relation Age of Onset  . Depression Mother   . Diabetes Father   . Hypertension Father   . Hyperlipidemia Father   . Depression Brother   . Colon cancer Maternal Grandfather   . Prostate cancer Maternal Grandfather   . Melanoma Maternal Grandfather   . Lung cancer Paternal Aunt        smoker  Outpatient Encounter Prescriptions as of 07/17/2017  Medication Sig  . albuterol (PROVENTIL HFA;VENTOLIN HFA) 108 (90 BASE) MCG/ACT inhaler Inhale into the lungs every 6 (six) hours as needed for wheezing or shortness of breath.  . medroxyPROGESTERone (DEPO-PROVERA) 150 MG/ML injection Inject 1 mL (150 mg total) into the muscle every 3 (three) months.  . Probiotic Product (PROBIOTIC DAILY PO) Take by mouth.  . [DISCONTINUED] Rivaroxaban 15 & 20 MG TBPK Take by mouth.  . rivaroxaban (XARELTO) 20 MG TABS tablet Take 1 tablet (20 mg total) by mouth daily with supper.  . [DISCONTINUED] Rivaroxaban 15 & 20 MG TBPK Take by mouth.  . [DISCONTINUED] medroxyPROGESTERone (DEPO-PROVERA) injection 150 mg    No facility-administered encounter medications on file as of 07/17/2017.          Objective:   Physical Exam  Constitutional: She is oriented to person, place, and time.  She appears well-developed and well-nourished.  HENT:  Head: Normocephalic and atraumatic.  Cardiovascular: Normal rate, regular rhythm and normal heart sounds.   Pulmonary/Chest: Effort normal and breath sounds normal.  Neurological: She is alert and oriented to person, place, and time.  Skin: Skin is warm and dry.  Psychiatric: She has a normal mood and affect. Her behavior is normal.          Assessment & Plan:  PE - will refill her Xarelto 20 mg daily.Discussed the importance of not missing her doses.she can always call after call nurse if needed.Plan to decrease down to 10mg  in one year depending on recommendations from oncology.  Menorrhagia secondary to being on anticoagulant-currently well controlled on Depo-Provera injection.  Will screen lipids as well as screen for diabetes. Lab entered.

## 2017-07-26 ENCOUNTER — Ambulatory Visit (HOSPITAL_BASED_OUTPATIENT_CLINIC_OR_DEPARTMENT_OTHER): Payer: 59 | Admitting: Hematology & Oncology

## 2017-07-26 ENCOUNTER — Other Ambulatory Visit (HOSPITAL_BASED_OUTPATIENT_CLINIC_OR_DEPARTMENT_OTHER): Payer: 59

## 2017-07-26 ENCOUNTER — Ambulatory Visit (HOSPITAL_BASED_OUTPATIENT_CLINIC_OR_DEPARTMENT_OTHER)
Admission: RE | Admit: 2017-07-26 | Discharge: 2017-07-26 | Disposition: A | Discharge status: Home / Self Care | Payer: 59 | Source: Ambulatory Visit | Attending: Hematology & Oncology | Admitting: Hematology & Oncology

## 2017-07-26 VITALS — BP 119/69 | HR 63 | Temp 98.5°F | Resp 20 | Wt 167.2 lb

## 2017-07-26 DIAGNOSIS — Z86711 Personal history of pulmonary embolism: Secondary | ICD-10-CM | POA: Diagnosis not present

## 2017-07-26 DIAGNOSIS — Z7901 Long term (current) use of anticoagulants: Secondary | ICD-10-CM | POA: Diagnosis not present

## 2017-07-26 DIAGNOSIS — I2699 Other pulmonary embolism without acute cor pulmonale: Secondary | ICD-10-CM

## 2017-07-26 LAB — COMPLETE METABOLIC PANEL WITH GFR
AG Ratio: 1.4 (calc) (ref 1.0–2.5)
ALKALINE PHOSPHATASE (APISO): 62 U/L (ref 33–115)
ALT: 44 U/L — AB (ref 6–29)
AST: 22 U/L (ref 10–30)
Albumin: 4.4 g/dL (ref 3.6–5.1)
BILIRUBIN TOTAL: 0.5 mg/dL (ref 0.2–1.2)
BUN: 9 mg/dL (ref 7–25)
CHLORIDE: 103 mmol/L (ref 98–110)
CO2: 22 mmol/L (ref 20–32)
CREATININE: 0.71 mg/dL (ref 0.50–1.10)
Calcium: 9.7 mg/dL (ref 8.6–10.2)
GFR, EST AFRICAN AMERICAN: 135 mL/min/{1.73_m2} (ref >=60)
GFR, Est Non African American: 117 mL/min/{1.73_m2} (ref >=60)
GLUCOSE: 82 mg/dL (ref 65–99)
Globulin: 3.1 g/dL (calc) (ref 1.9–3.7)
Potassium: 4 mmol/L (ref 3.5–5.3)
Sodium: 137 mmol/L (ref 135–146)
Total Protein: 7.5 g/dL (ref 6.1–8.1)

## 2017-07-26 LAB — CBC WITH DIFFERENTIAL (CANCER CENTER ONLY)
BASO#: 0 10*3/uL (ref 0.0–0.2)
BASO%: 0.2 % (ref 0.0–2.0)
EOS ABS: 0.1 10*3/uL (ref 0.0–0.5)
EOS%: 1 % (ref 0.0–7.0)
HCT: 41.5 % (ref 34.8–46.6)
HGB: 14.1 g/dL (ref 11.6–15.9)
LYMPH#: 2.8 10*3/uL (ref 0.9–3.3)
LYMPH%: 26.3 % (ref 14.0–48.0)
MCH: 29.3 pg (ref 26.0–34.0)
MCHC: 34 g/dL (ref 32.0–36.0)
MCV: 86 fL (ref 81–101)
MONO#: 0.5 10*3/uL (ref 0.1–0.9)
MONO%: 4.4 % (ref 0.0–13.0)
NEUT%: 68.1 % (ref 39.6–80.0)
NEUTROS ABS: 7.3 10*3/uL — AB (ref 1.5–6.5)
PLATELETS: 301 10*3/uL (ref 145–400)
RBC: 4.81 10*6/uL (ref 3.70–5.32)
RDW: 12.9 % (ref 11.1–15.7)
WBC: 10.7 10*3/uL — ABNORMAL HIGH (ref 3.9–10.0)

## 2017-07-26 LAB — LIPID PANEL W/REFLEX DIRECT LDL
CHOL/HDL RATIO: 3 (calc) (ref <5.0)
Cholesterol: 141 mg/dL (ref <200)
HDL: 47 mg/dL — ABNORMAL LOW (ref >50)
LDL CHOLESTEROL (CALC): 78 mg/dL
Non-HDL Cholesterol (Calc): 94 mg/dL (calc) (ref <130)
TRIGLYCERIDES: 82 mg/dL (ref <150)

## 2017-07-26 MED ORDER — IOPAMIDOL (ISOVUE-370) INJECTION 76%
100.0000 mL | Freq: Once | INTRAVENOUS | Status: AC | PRN
  Administered 2017-07-26: 100 mL via INTRAVENOUS

## 2017-07-26 NOTE — Progress Notes (Signed)
Hematology and Oncology Follow Up Visit  [name] [id number] Feb 04, 1991 26 y.o. 07/26/2017   Principle Diagnosis:   Pulmonary embolism of the lingula artery  Positive lupus anticoagulant - possibly transient  Protein S deficiency (?)  Current Therapy:    Xarelto 20 mg by mouth daily - complete 1 year in June 2019     Interim History:  [name] is back for Michaela second office visit. We first saw Michaela back in July. At that time, she had lab work done. She was found to have a lupus anticoagulant. I'm not sure if this is truly significant. Michaela protein S level was only 47%.  She had a repeat CT angiogram of Michaela chest. This was done today. The pulmonary embolism had resolved.  She's had no bleeding. She's had no problem with Michaela monthly cycle.  She still states some occasional discomfort in Michaela left chest. I told Michaela that this is not uncommon. That it could be present for several months.  She's had no problems with leg swelling. She's had no rashes. She's had no change in bowel or bladder habits.  [name] is coming up at the end of this month. She and Michaela daughter [place] going to [place].  Overall, Michaela performance status is ECOG 0.  Medications:  Current Outpatient Prescriptions:  .  albuterol (PROVENTIL HFA;VENTOLIN HFA) 108 (90 BASE) MCG/ACT inhaler, Inhale into the lungs every 6 (six) hours as needed for wheezing or shortness of breath., Disp: , Rfl:  .  medroxyPROGESTERone (DEPO-PROVERA) 150 MG/ML injection, Inject 1 mL (150 mg total) into the muscle every 3 (three) months., Disp: 1 mL, Rfl: 3 .  Probiotic Product (PROBIOTIC DAILY PO), Take by mouth., Disp: , Rfl:  .  rivaroxaban (XARELTO) 20 MG TABS tablet, Take 1 tablet (20 mg total) by mouth daily with supper., Disp: 30 tablet, Rfl: 10  Allergies:  Allergies  Allergen Reactions  . Estrogens Other (See Comments)    Hx of PE on estrogen  . Latex     Past Medical History, Surgical history,  Social history, and Family History were reviewed and updated.  Review of Systems: Review of Systems  Constitutional: Negative for appetite change, fatigue, fever and unexpected weight change.  HENT:   Negative for lump/mass, mouth sores, sore throat and trouble swallowing.   Respiratory: Negative for cough, hemoptysis and shortness of breath.   Cardiovascular: Negative for leg swelling and palpitations.  Gastrointestinal: Negative for abdominal distention, abdominal pain, blood in stool, constipation, diarrhea, nausea and vomiting.  Genitourinary: Negative for bladder incontinence, dysuria, frequency and hematuria.   Musculoskeletal: Negative for arthralgias, back pain, gait problem and myalgias.  Skin: Negative for itching and rash.  Neurological: Negative for dizziness, extremity weakness, gait problem, headaches, numbness, seizures and speech difficulty.  Hematological: Does not bruise/bleed easily.  Psychiatric/Behavioral: Negative for depression and sleep disturbance. The patient is not nervous/anxious.     Physical Exam:  weight is 167 lb 4 oz (75.9 kg). Michaela oral temperature is 98.5 F (36.9 C). Michaela blood pressure is 119/69 and Michaela pulse is 63. Michaela respiration is 20 and oxygen saturation is 100%.   Wt Readings from Last 3 Encounters:  07/26/17 167 lb 4 oz (75.9 kg)  07/17/17 167 lb (75.8 kg)  06/25/17 170 lb (77.1 kg)    Physical Exam  Constitutional: She is oriented to person, place, and time.  HENT:  Head: Normocephalic and atraumatic.  Mouth/Throat: Oropharynx is clear and moist.  Eyes: Pupils  are equal, round, and reactive to light. EOM are normal.  Neck: Normal range of motion.  Cardiovascular: Normal rate, regular rhythm and normal heart sounds.   Pulmonary/Chest: Effort normal and breath sounds normal.  Abdominal: Soft. Bowel sounds are normal.  Musculoskeletal: Normal range of motion. She exhibits no edema, tenderness or deformity.  Lymphadenopathy:    She has no  cervical adenopathy.  Neurological: She is alert and oriented to person, place, and time.  Skin: Skin is warm and dry. No rash noted. No erythema.  Psychiatric: She has a normal mood and affect. Michaela behavior is normal. Judgment and thought content normal.  Vitals reviewed.    Lab Results  Component Value Date   WBC 10.7 (H) 07/26/2017   HGB 14.1 07/26/2017   HCT 41.5 07/26/2017   MCV 86 07/26/2017   PLT 301 07/26/2017     Chemistry      Component Value Date/Time   NA 137 05/30/2017 1441   K 3.3 05/30/2017 1441   CL 101 05/30/2017 1441   CO2 29 05/30/2017 1441   BUN 8 05/30/2017 1441   CREATININE 0.8 05/30/2017 1441      Component Value Date/Time   CALCIUM 9.0 05/30/2017 1441   ALKPHOS 64 05/30/2017 1441   AST 31 05/30/2017 1441   ALT 39 05/30/2017 1441   BILITOT 0.70 05/30/2017 1441         Impression and Plan: [name] is a 26 year old white female. She had a pulmonary embolism. This was diagnosed back in June.  She is on Xarelto. I suspect that she probably will need Xarelto at a therapeutic dose for one year.  We are rechecking Michaela lupus anticoagulant. If this is still positive, I will add baby aspirin to the Xarelto.  We are rechecking Michaela Protein S level.  I think we can see Michaela back in 4 months. I think we can get Michaela through the holidays.  If she has any problems, she was back to see [place].   Josph Macho, MD 9/13/20183:10 PM

## 2017-07-27 LAB — HEMOGLOBIN A1C
EAG (MMOL/L): 5.2 (calc)
Hgb A1c MFr Bld: 4.9 % of total Hgb (ref <5.7)
Mean Plasma Glucose: 94 (calc)

## 2017-07-27 LAB — D-DIMER, QUANTITATIVE (NOT AT ARMC): D-DIMER: 0.33 mg{FEU}/L (ref 0.00–0.49)

## 2017-07-30 ENCOUNTER — Telehealth: Payer: Self-pay | Admitting: *Deleted

## 2017-07-30 LAB — PROTEIN S, TOTAL: PROTEIN S AG TOTAL: 92 % (ref 60–150)

## 2017-07-30 LAB — PROTEIN S, ANTIGEN, FREE: Protein S, Free: 102 % (ref 57–157)

## 2017-07-30 LAB — LUPUS ANTICOAGULANT PANEL
PTT-LA: 29.6 s (ref 0.0–51.9)
dRVVT: 44.3 s (ref 0.0–47.0)

## 2017-07-30 LAB — PROTEIN S ACTIVITY: Protein S-Functional: 132 % (ref 63–140)

## 2017-07-30 NOTE — Telephone Encounter (Signed)
-----   Message from Josph Macho, MD sent at 07/30/2017  8:16 AM EDT ----- Call - there is NO lupus anti-coagulant!!  No need to start aspirin!!!  Cindee Lame

## 2017-07-30 NOTE — Telephone Encounter (Signed)
Pt's form completed,faxed,copied,scanned, and confirmation received.Marland KitchenMarland KitchenLoralee Pacas [place]

## 2017-08-25 ENCOUNTER — Encounter: Payer: Self-pay | Admitting: Emergency Medicine

## 2017-08-25 ENCOUNTER — Emergency Department (INDEPENDENT_AMBULATORY_CARE_PROVIDER_SITE_OTHER)
Admission: EM | Admit: 2017-08-25 | Discharge: 2017-08-25 | Disposition: A | Discharge status: Home / Self Care | Payer: 59 | Source: Home / Self Care

## 2017-08-25 DIAGNOSIS — N390 Urinary tract infection, site not specified: Secondary | ICD-10-CM

## 2017-08-25 DIAGNOSIS — R319 Hematuria, unspecified: Secondary | ICD-10-CM

## 2017-08-25 LAB — POCT URINALYSIS DIP (MANUAL ENTRY)
GLUCOSE UA: NEGATIVE mg/dL
Nitrite, UA: POSITIVE — AB
Protein Ur, POC: 100 mg/dL — AB
Spec Grav, UA: >=1.03 — AB (ref 1.010–1.025)
Urobilinogen, UA: 1 E.U./dL
pH, UA: 5 (ref 5.0–8.0)

## 2017-08-25 MED ORDER — CEPHALEXIN 500 MG PO CAPS: 500.0000 mg | ORAL_CAPSULE | Freq: Four times a day (QID) | ORAL | 0 refills | Status: DC

## 2017-08-25 NOTE — ED Provider Notes (Signed)
[facility]    CSN: [id number] Arrival date & time: 08/25/17  1962     History   Chief Complaint Chief Complaint  Patient presents with  . Dysuria    HPI [name] is a 26 y.o. female.   The history is provided by the patient. No language interpreter was used.  Dysuria  Pain quality:  Aching Pain severity:  Moderate Onset quality:  Gradual Duration:  1 day Timing:  Constant Progression:  Worsening Chronicity:  New Recent urinary tract infections: no   Relieved by:  Nothing Worsened by:  Nothing Ineffective treatments:  None tried Urinary symptoms: frequent urination   Associated symptoms: no abdominal pain and no nausea   Risk factors: no sexually transmitted infections     Past Medical History:  Diagnosis Date  . Allergy   . Anxiety   . Depression     Patient Active Problem List   Diagnosis Date Noted  . PE (pulmonary thromboembolism) (HCC) 05/02/2017  . Metatarsal stress fracture of right foot 05/18/2016  . GAD (generalized anxiety disorder) 10/07/2014  . ALLERGIC RHINITIS CAUSE UNSPECIFIED 04/07/2008    Past Surgical History:  Procedure Laterality Date  . CHOLECYSTECTOMY  08-2009    OB History    Gravida Para Term Preterm AB Living   1 1 1     1    SAB TAB Ectopic Multiple Live Births                   Home Medications    Prior to Admission medications   Medication Sig Start Date End Date Taking? Authorizing Provider  albuterol (PROVENTIL HFA;VENTOLIN HFA) 108 (90 BASE) MCG/ACT inhaler Inhale into the lungs every 6 (six) hours as needed for wheezing or shortness of breath.    [provider]  medroxyPROGESTERone (DEPO-PROVERA) 150 MG/ML injection Inject 1 mL (150 mg total) into the muscle every 3 (three) months. 06/25/17   06/27/17, MD  Probiotic Product (PROBIOTIC DAILY PO) Take by mouth.    [provider]  rivaroxaban (XARELTO) 20 MG TABS tablet Take 1 tablet (20 mg total) by mouth daily with  supper. 07/17/17   09/16/17, MD    Family History Family History  Problem Relation Age of Onset  . Depression Mother   . Diabetes Father   . Hypertension Father   . Hyperlipidemia Father   . Depression Brother   . Colon cancer Maternal Grandfather   . Prostate cancer Maternal Grandfather   . Melanoma Maternal Grandfather   . Lung cancer Paternal Aunt        smoker     Social History Social History  Substance Use Topics  . Smoking status: Former Smoker    Quit date: 11/14/2007  . Smokeless tobacco: Never Used  . Alcohol use Yes     Comment: once every couple of months.      Allergies   Estrogens and Latex   Review of Systems Review of Systems  Gastrointestinal: Negative for abdominal pain and nausea.  Genitourinary: Positive for dysuria.  All other systems reviewed and are negative.    Physical Exam Triage Vital Signs ED Triage Vitals  Enc Vitals Group     BP 08/25/17 1022 117/78     Pulse Rate 08/25/17 1022 86     Resp 08/25/17 1022 16     Temp 08/25/17 1022 98.5 F (36.9 C)     Temp Source 08/25/17 1022 Oral     SpO2 08/25/17  1022 99 %     Weight 08/25/17 1022 167 lb (75.8 kg)     Height 08/25/17 1022 5\' 5"  (1.651 m)     Head Circumference --      Peak Flow --      Pain Score 08/25/17 1023 4     Pain Loc --      Pain Edu? --      Excl. in GC? --    No data found.   Updated Vital Signs BP 117/78 (BP Location: Left Arm)   Pulse 86   Temp 98.5 F (36.9 C) (Oral)   Resp 16   Ht 5\' 5"  (1.651 m)   Wt 167 lb (75.8 kg)   SpO2 99%   BMI 27.79 kg/m   Visual Acuity Right Eye Distance:   Left Eye Distance:   Bilateral Distance:    Right Eye Near:   Left Eye Near:    Bilateral Near:     Physical Exam  Constitutional: She appears well-developed and well-nourished. No distress.  HENT:  Head: Normocephalic and atraumatic.  Eyes: Conjunctivae are normal.  Neck: Neck supple.  Cardiovascular: Normal rate and regular rhythm.   No  murmur heard. Pulmonary/Chest: Effort normal and breath sounds normal. No respiratory distress.  Abdominal: Soft. There is no tenderness.  Musculoskeletal: She exhibits no edema.  Neurological: She is alert.  Skin: Skin is warm and dry.  Psychiatric: She has a normal mood and affect.  Nursing note and vitals reviewed.    UC Treatments / Results  Labs (all labs ordered are listed, but only abnormal results are displayed) Labs Reviewed  POCT URINALYSIS DIP (MANUAL ENTRY) - Abnormal; Notable for the following:       Result Value   Bilirubin, UA small (*)    Ketones, POC UA trace (5) (*)    Spec Grav, UA >=1.030 (*)    Blood, UA large (*)    Protein Ur, POC =100 (*)    Nitrite, UA Positive (*)    Leukocytes, UA Trace (*)    All other components within normal limits  URINE CULTURE    EKG  EKG Interpretation None       Radiology No results found.  Procedures Procedures (including critical care time)  Medications Ordered in UC Medications - No data to display   Initial Impression / Assessment and Plan / UC Course  I have reviewed the triage vital signs and the nursing notes.  Pertinent labs & imaging results that were available during my care of the patient were reviewed by me and considered in my medical decision making (see chart for details).     Urine reviewed.  Pt given rx for keflex Urine culture ordered.  Final Clinical Impressions(s) / UC Diagnoses   Final diagnoses:  Urinary tract infection with hematuria, site unspecified    New Prescriptions New Prescriptions   CEPHALEXIN (KEFLEX) 500 MG CAPSULE    Take 1 capsule (500 mg total) by mouth 4 (four) times daily.    An After Visit Summary was printed and given to the patient.  Controlled Substance Prescriptions [place] Controlled Substance Registry consulted? Not Applicable   08/27/17, 08/25/17 1053

## 2017-08-25 NOTE — ED Triage Notes (Signed)
Dysuria x 1 day 

## 2017-08-27 LAB — URINE CULTURE
MICRO NUMBER:: 81145467
SPECIMEN QUALITY: ADEQUATE

## 2017-08-28 ENCOUNTER — Telehealth: Payer: Self-pay | Admitting: *Deleted

## 2017-08-28 NOTE — Telephone Encounter (Signed)
Callback: No answer, LMOM f/u from visit. UCX non-specific. Complete course if improving. Call back as needed.

## 2017-09-15 ENCOUNTER — Emergency Department (INDEPENDENT_AMBULATORY_CARE_PROVIDER_SITE_OTHER)
Admission: EM | Admit: 2017-09-15 | Discharge: 2017-09-15 | Disposition: A | Discharge status: Home / Self Care | Payer: 59 | Source: Home / Self Care | Attending: Family Medicine | Admitting: Family Medicine

## 2017-09-15 ENCOUNTER — Encounter: Payer: Self-pay | Admitting: Emergency Medicine

## 2017-09-15 DIAGNOSIS — R1031 Right lower quadrant pain: Secondary | ICD-10-CM | POA: Diagnosis not present

## 2017-09-15 DIAGNOSIS — R102 Pelvic and perineal pain: Secondary | ICD-10-CM

## 2017-09-15 LAB — POCT URINALYSIS DIP (MANUAL ENTRY)
Bilirubin, UA: NEGATIVE
Glucose, UA: NEGATIVE mg/dL
Ketones, POC UA: NEGATIVE mg/dL
Leukocytes, UA: NEGATIVE
Nitrite, UA: NEGATIVE
Protein Ur, POC: NEGATIVE mg/dL
Spec Grav, UA: 1.025 (ref 1.010–1.025)
Urobilinogen, UA: 0.2 E.U./dL
pH, UA: 6 (ref 5.0–8.0)

## 2017-09-15 NOTE — ED Triage Notes (Signed)
Patient presents to [facility] with C/O pain in the RLQ pain started around 2 am on 09/14/17, Patient denies possibility of pregnancy. Also states she had blood in her urine.

## 2017-09-15 NOTE — Discharge Instructions (Signed)
°  You may take 500mg  acetaminophen every 4-6 hours or in combination with ibuprofen 400-600mg  every 6-8 hours as needed for pain and inflammation.  You may try over the counter medication called Azo to help with bladder spasms.  This medication can make your urine orange, which is normal.

## 2017-09-15 NOTE — ED Provider Notes (Signed)
[facility]    CSN: [id number] Arrival date & time: 09/15/17  1423     History   Chief Complaint Chief Complaint  Patient presents with  . Abdominal Pain    HPI [name] is a 26 y.o. female.   HPI [name] is a 26 y.o. female presenting to UC with c/o RLQ abdominal pain that woke up up at 2AM on Friday (09/14/17) morning. Pain eventually subsided but has been waxing and waning since onset. Pain was worse this morning but is currently 2/10.  She was seen about 3 weeks ago, dx and treated for a UTI. She reports noticing blood in her urine but denies pain with urination.  Denies concern for pregnancy.  She is on birth control and LMP: 09/01/17.  She has had an ovarian cyst in the past but states that was pin-point pain on Left side and this pain seems to move from suprapubic region to RLQ.  No hx of kidney stones. Denies concern for STDs. She has had her gallbladder removed but still has her appendix. Denies fever, chills, n/v/d.    Past Medical History:  Diagnosis Date  . Allergy   . Anxiety   . Depression     Patient Active Problem List   Diagnosis Date Noted  . PE (pulmonary thromboembolism) (HCC) 05/02/2017  . Metatarsal stress fracture of right foot 05/18/2016  . GAD (generalized anxiety disorder) 10/07/2014  . ALLERGIC RHINITIS CAUSE UNSPECIFIED 04/07/2008    Past Surgical History:  Procedure Laterality Date  . CHOLECYSTECTOMY  08-2009    OB History    Gravida Para Term Preterm AB Living   1 1 1     1    SAB TAB Ectopic Multiple Live Births                   Home Medications    Prior to Admission medications   Medication Sig Start Date End Date Taking? Authorizing Provider  albuterol (PROVENTIL HFA;VENTOLIN HFA) 108 (90 BASE) MCG/ACT inhaler Inhale into the lungs every 6 (six) hours as needed for wheezing or shortness of breath.    [provider]  cephALEXin (KEFLEX) 500 MG capsule Take 1 capsule (500 mg total) by mouth 4  (four) times daily. 08/25/17   08/27/17, PA-C  medroxyPROGESTERone (DEPO-PROVERA) 150 MG/ML injection Inject 1 mL (150 mg total) into the muscle every 3 (three) months. 06/25/17   06/27/17, MD  Probiotic Product (PROBIOTIC DAILY PO) Take by mouth.    [provider]  rivaroxaban (XARELTO) 20 MG TABS tablet Take 1 tablet (20 mg total) by mouth daily with supper. 07/17/17   09/16/17, MD    Family History Family History  Problem Relation Age of Onset  . Depression Mother   . Diabetes Father   . Hypertension Father   . Hyperlipidemia Father   . Depression Brother   . Colon cancer Maternal Grandfather   . Prostate cancer Maternal Grandfather   . Melanoma Maternal Grandfather   . Lung cancer Paternal Aunt        smoker     Social History Social History  Substance Use Topics  . Smoking status: Former Smoker    Quit date: 11/14/2007  . Smokeless tobacco: Never Used  . Alcohol use No     Comment: once every couple of months.      Allergies   Estrogens and Latex   Review of Systems Review of Systems  Constitutional:  Negative for appetite change, chills and fever.  Gastrointestinal: Positive for abdominal pain. Negative for diarrhea, nausea and vomiting.  Genitourinary: Positive for hematuria and pelvic pain. Negative for decreased urine volume, dysuria, flank pain, frequency, genital sores, menstrual problem, urgency, vaginal bleeding, vaginal discharge and vaginal pain.  Musculoskeletal: Negative for back pain and myalgias.  Skin: Negative for rash.     Physical Exam Triage Vital Signs ED Triage Vitals  Enc Vitals Group     BP 09/15/17 1445 139/85     Pulse Rate 09/15/17 1445 82     Resp 09/15/17 1445 16     Temp 09/15/17 1445 98.3 F (36.8 C)     Temp Source 09/15/17 1445 Oral     SpO2 09/15/17 1445 100 %     Weight 09/15/17 1446 167 lb (75.8 kg)     Height 09/15/17 1446 5\' 5"  (1.651 m)     Head Circumference --      Peak Flow --       Pain Score 09/15/17 1446 2     Pain Loc --      Pain Edu? --      Excl. in GC? --    No data found.   Updated Vital Signs BP 139/85 (BP Location: Left Arm)   Pulse 82   Temp 98.3 F (36.8 C) (Oral)   Resp 16   Ht 5\' 5"  (1.651 m)   Wt 167 lb (75.8 kg)   LMP 09/01/2017 (Exact Date)   SpO2 100%   BMI 27.79 kg/m   Visual Acuity Right Eye Distance:   Left Eye Distance:   Bilateral Distance:    Right Eye Near:   Left Eye Near:    Bilateral Near:     Physical Exam  Constitutional: She is oriented to person, place, and time. She appears well-developed and well-nourished. No distress.  Pt sitting comfortably on exam bed. NAD  HENT:  Head: Normocephalic and atraumatic.  Mouth/Throat: Oropharynx is clear and moist.  Eyes: EOM are normal.  Neck: Normal range of motion.  Cardiovascular: Normal rate and regular rhythm.   Pulmonary/Chest: Effort normal and breath sounds normal. No respiratory distress. She has no wheezes. She has no rales.  Abdominal: Soft. She exhibits no distension and no mass. There is tenderness in the suprapubic area. There is no rebound and no guarding.  Soft, non-distended. Mild suprapubic tenderness but no tenderness to RLQ. No rebound or guarding.  Musculoskeletal: Normal range of motion.  Neurological: She is alert and oriented to person, place, and time.  Skin: Skin is warm and dry. She is not diaphoretic.  Psychiatric: She has a normal mood and affect. Her behavior is normal.  Nursing note and vitals reviewed.    UC Treatments / Results  Labs (all labs ordered are listed, but only abnormal results are displayed) Labs Reviewed  POCT URINALYSIS DIP (MANUAL ENTRY) - Abnormal; Notable for the following:       Result Value   Blood, UA trace-intact (*)    All other components within normal limits  URINE CULTURE    EKG  EKG Interpretation None       Radiology No results found.  Procedures Procedures (including critical care  time)  Medications Ordered in UC Medications - No data to display   Initial Impression / Assessment and Plan / UC Course  I have reviewed the triage vital signs and the nursing notes.  Pertinent labs & imaging results that were available during my care of  the patient were reviewed by me and considered in my medical decision making (see chart for details).     UA: unremarkable Abdominal exam- not concerning for acute abdomen Symptoms have been waxing and waning for over 24 hours. Pt denies fever, chills, n/v/d. No tenderness to RLQ at this time. Doubt appendicitis. Pain could be due to ruptured ovarian cyst due to hx of same in the past.  Reassured pt of no emergent signs or symptoms in UC, however, advised pt to go to emergency department for imaging and stat labs if pain worsens again this evening. If not worsening, pt may f/u with PCP or OB/GYN this week for recheck of symptoms and pelvic U/S. Pt info packet provided.  Final Clinical Impressions(s) / UC Diagnoses   Final diagnoses:  RLQ abdominal pain  Pelvic pain in female    New Prescriptions Discharge Medication List as of 09/15/2017  3:07 PM       Controlled Substance Prescriptions [place] Controlled Substance Registry consulted? Not Applicable   Rolla Plate 09/15/17 1615

## 2017-09-17 ENCOUNTER — Encounter: Payer: Self-pay | Admitting: Family Medicine

## 2017-09-17 ENCOUNTER — Ambulatory Visit: Payer: 59 | Admitting: Family Medicine

## 2017-09-17 ENCOUNTER — Ambulatory Visit (INDEPENDENT_AMBULATORY_CARE_PROVIDER_SITE_OTHER): Payer: 59

## 2017-09-17 ENCOUNTER — Other Ambulatory Visit: Payer: Self-pay | Admitting: Family Medicine

## 2017-09-17 ENCOUNTER — Ambulatory Visit: Payer: 59

## 2017-09-17 VITALS — BP 129/84 | HR 96 | Ht 65.0 in | Wt 168.0 lb

## 2017-09-17 DIAGNOSIS — R1033 Periumbilical pain: Secondary | ICD-10-CM

## 2017-09-17 DIAGNOSIS — R1031 Right lower quadrant pain: Secondary | ICD-10-CM

## 2017-09-17 LAB — URINE CULTURE
MICRO NUMBER:: 81238421
SPECIMEN QUALITY:: ADEQUATE

## 2017-09-17 LAB — CBC WITH DIFFERENTIAL/PLATELET
BASOS ABS: 18 {cells}/uL (ref 0–200)
Basophils Relative: 0.2 %
EOS PCT: 1.8 %
Eosinophils Absolute: 158 cells/uL (ref 15–500)
HEMATOCRIT: 40.4 % (ref 35.0–45.0)
Hemoglobin: 13.8 g/dL (ref 11.7–15.5)
LYMPHS ABS: 3001 {cells}/uL (ref 850–3900)
MCH: 29.3 pg (ref 27.0–33.0)
MCHC: 34.2 g/dL (ref 32.0–36.0)
MCV: 85.8 fL (ref 80.0–100.0)
MPV: 10.3 fL (ref 7.5–12.5)
Monocytes Relative: 5.9 %
NEUTROS PCT: 58 %
Neutro Abs: 5104 cells/uL (ref 1500–7800)
Platelets: 294 10*3/uL (ref 140–400)
RBC: 4.71 10*6/uL (ref 3.80–5.10)
RDW: 12.4 % (ref 11.0–15.0)
Total Lymphocyte: 34.1 %
WBC mixed population: 519 cells/uL (ref 200–950)
WBC: 8.8 10*3/uL (ref 3.8–10.8)

## 2017-09-17 NOTE — Progress Notes (Signed)
Subjective:    Patient ID: [name], female    DOB: October 24, 1991, 26 y.o.   MRN: [id number]  HPI 26 yo female comes in with RLQ that is dull and stabbing and pressure. Started on 09/14/18.  Felt nauseated last night. Last BM was this morning.  She was seen at [facility] on 09/15/17.  No fever, sweats, or chills.  No dysuria.  She has had an ovarian cyst in the past.  She has never had a kidney stone.  She has been noticing noticing some blood right at the urethra and a little bit when she urinates.  She is not having any dysuria whatsoever.  She did have a urinary tract infection about 2 weeks ago but took the full course of antibiotics.  Pain is now persistent and never completely lets up but it does vary in intensity during the day.  He feels like it is more over just the lower abdomen in general.  Says laughing makes it hurt.   Review of Systems   BP 129/84   Pulse 96   Ht 5\' 5"  (1.651 m)   Wt 168 lb (76.2 kg)   LMP 09/01/2017 (Exact Date)   SpO2 100%   BMI 27.96 kg/m     Allergies  Allergen Reactions  . Estrogens Other (See Comments)    Hx of PE on estrogen  . Latex     Past Medical History:  Diagnosis Date  . Allergy   . Anxiety   . Depression     Past Surgical History:  Procedure Laterality Date  . CHOLECYSTECTOMY  08-2009    Social History   Socioeconomic History  . Marital status: Single    Spouse name: Not on file  . Number of children: 1  . Years of education: Not on file  . Highest education level: Not on file  Social Needs  . Financial resource strain: Not on file  . Food insecurity - worry: Not on file  . Food insecurity - inability: Not on file  . Transportation needs - medical: Not on file  . Transportation needs - non-medical: Not on file  Occupational History  . Occupation: 09/03/2017: [employer]    Comment: [employer] [place]   Tobacco Use  . Smoking status: Former Smoker    Last attempt to quit: 11/14/2007    Years since  quitting: 9.8  . Smokeless tobacco: Never Used  Substance and Sexual Activity  . Alcohol use: No    Comment: once every couple of months.   . Drug use: No  . Sexual activity: Yes    Partners: Male    Birth control/protection: Patch    Comment:  HS diploma, 5 caffeine drinks daily,no regular exercise.  Other Topics Concern  . Not on file  Social History Narrative   Exercise 3-4 days per week. 1-2 cups  Caffeine per day. .      Family History  Problem Relation Age of Onset  . Depression Mother   . Diabetes Father   . Hypertension Father   . Hyperlipidemia Father   . Depression Brother   . Colon cancer Maternal Grandfather   . Prostate cancer Maternal Grandfather   . Melanoma Maternal Grandfather   . Lung cancer Paternal Aunt        smoker     Outpatient Encounter Medications as of 09/17/2017  Medication Sig  . albuterol (PROVENTIL HFA;VENTOLIN HFA) 108 (90 BASE) MCG/ACT inhaler Inhale into the  lungs every 6 (six) hours as needed for wheezing or shortness of breath.  . medroxyPROGESTERone (DEPO-PROVERA) 150 MG/ML injection Inject 1 mL (150 mg total) into the muscle every 3 (three) months.  . Probiotic Product (PROBIOTIC DAILY PO) Take by mouth.  . rivaroxaban (XARELTO) 20 MG TABS tablet Take 1 tablet (20 mg total) by mouth daily with supper.  . [DISCONTINUED] cephALEXin (KEFLEX) 500 MG capsule Take 1 capsule (500 mg total) by mouth 4 (four) times daily.   No facility-administered encounter medications on file as of 09/17/2017.           Objective:   Physical Exam  Constitutional: She is oriented to person, place, and time. She appears well-developed and well-nourished.  HENT:  Head: Normocephalic and atraumatic.  Cardiovascular: Normal rate, regular rhythm and normal heart sounds.  Pulmonary/Chest: Effort normal and breath sounds normal.  Abdominal: Soft. Bowel sounds are normal. She exhibits no distension and no mass. There is tenderness. There is no rebound and no  guarding.    When I let go in the right upper quadrant quickly she actually felt pain around the umbilicus.  Neurological: She is alert and oriented to person, place, and time.  Skin: Skin is warm and dry.  Psychiatric: She has a normal mood and affect. Her behavior is normal.        Assessment & Plan:  Lower abdominal pain-unclear etiology.  Most of her pain is in the right lower quadrant but on exam today she is actually mostly tender over the suprapubic area up to the umbilicus.  Her urine culture is negative but it is unusual that she is seeing urethral blood.  We will start with pelvic ultrasound and abdominal ultrasound initially.  If we are unable to find anything specific such as an ovarian cyst etc. then recommend that we move forward with CT scan.

## 2017-09-18 ENCOUNTER — Ambulatory Visit (INDEPENDENT_AMBULATORY_CARE_PROVIDER_SITE_OTHER): Payer: 59

## 2017-09-18 DIAGNOSIS — R1031 Right lower quadrant pain: Secondary | ICD-10-CM

## 2017-09-18 DIAGNOSIS — Z9049 Acquired absence of other specified parts of digestive tract: Secondary | ICD-10-CM

## 2017-09-18 DIAGNOSIS — R1033 Periumbilical pain: Secondary | ICD-10-CM

## 2017-10-01 ENCOUNTER — Ambulatory Visit (INDEPENDENT_AMBULATORY_CARE_PROVIDER_SITE_OTHER): Payer: 59 | Admitting: *Deleted

## 2017-10-01 DIAGNOSIS — Z3042 Encounter for surveillance of injectable contraceptive: Secondary | ICD-10-CM | POA: Diagnosis not present

## 2017-10-01 MED ORDER — MEDROXYPROGESTERONE ACETATE 150 MG/ML IM SUSP
150.0000 mg | INTRAMUSCULAR | Status: DC
  Administered 2017-10-01: 150 mg via INTRAMUSCULAR

## 2017-11-29 ENCOUNTER — Ambulatory Visit: Payer: 59 | Admitting: Hematology & Oncology

## 2017-11-29 ENCOUNTER — Other Ambulatory Visit: Payer: 59

## 2017-12-14 ENCOUNTER — Other Ambulatory Visit: Payer: Self-pay

## 2017-12-14 ENCOUNTER — Inpatient Hospital Stay: Payer: 59 | Attending: Hematology & Oncology

## 2017-12-14 ENCOUNTER — Inpatient Hospital Stay (HOSPITAL_BASED_OUTPATIENT_CLINIC_OR_DEPARTMENT_OTHER): Payer: 59 | Admitting: Hematology & Oncology

## 2017-12-14 ENCOUNTER — Encounter: Payer: Self-pay | Admitting: Hematology & Oncology

## 2017-12-14 VITALS — BP 122/65 | HR 75 | Temp 98.0°F | Wt 173.8 lb

## 2017-12-14 DIAGNOSIS — I2699 Other pulmonary embolism without acute cor pulmonale: Secondary | ICD-10-CM | POA: Insufficient documentation

## 2017-12-14 LAB — CBC WITH DIFFERENTIAL (CANCER CENTER ONLY)
BASOS ABS: 0 10*3/uL (ref 0.0–0.1)
Basophils Relative: 0 %
EOS ABS: 0.2 10*3/uL (ref 0.0–0.5)
EOS PCT: 1 %
HCT: 42.9 % (ref 34.8–46.6)
HEMOGLOBIN: 14.4 g/dL (ref 11.6–15.9)
LYMPHS ABS: 3.4 10*3/uL — AB (ref 0.9–3.3)
Lymphocytes Relative: 29 %
MCH: 29.9 pg (ref 26.0–34.0)
MCHC: 33.6 g/dL (ref 32.0–36.0)
MCV: 89 fL (ref 81.0–101.0)
Monocytes Absolute: 0.5 10*3/uL (ref 0.1–0.9)
Monocytes Relative: 4 %
NEUTROS PCT: 66 %
Neutro Abs: 7.9 10*3/uL — ABNORMAL HIGH (ref 1.5–6.5)
PLATELETS: 287 10*3/uL (ref 145–400)
RBC: 4.82 MIL/uL (ref 3.70–5.32)
RDW: 13.2 % (ref 11.1–15.7)
WBC: 12 10*3/uL — AB (ref 3.9–10.0)

## 2017-12-14 LAB — CMP (CANCER CENTER ONLY)
ALBUMIN: 4 g/dL (ref 3.5–5.0)
ALK PHOS: 64 U/L (ref 40–150)
ALT: 25 U/L (ref 0–55)
AST: 20 U/L (ref 5–34)
Anion gap: 11 (ref 3–11)
BUN: 8 mg/dL (ref 7–26)
CALCIUM: 9.5 mg/dL (ref 8.4–10.4)
CHLORIDE: 107 mmol/L (ref 98–109)
CO2: 22 mmol/L (ref 22–29)
CREATININE: 0.79 mg/dL (ref 0.60–1.10)
GFR, Estimated: >60 mL/min (ref >60)
GLUCOSE: 86 mg/dL (ref 70–140)
Potassium: 3.9 mmol/L (ref 3.5–5.1)
SODIUM: 140 mmol/L (ref 136–145)
Total Bilirubin: 0.5 mg/dL (ref 0.2–1.2)
Total Protein: 7.9 g/dL (ref 6.4–8.3)

## 2017-12-14 NOTE — Progress Notes (Signed)
Hematology and Oncology Follow Up Visit  [name] [id number] 10/11/1991 27 y.o. 12/14/2017   Principle Diagnosis:   Pulmonary embolism of the lingula artery  Positive lupus anticoagulant - possibly transient  Protein S deficiency (?)  Current Therapy:    Xarelto 20 mg by mouth daily - complete 1 year in June 2019     Interim History:  [name] is back for follow-up.  She is doing well.  She got through the holidays.  Her daughter enjoyed Christmas.  She is working without difficulty.  She is having no problems with cough or shortness of breath.  There is no chest wall pain.  She has had no weakness.  She had no bleeding.  She does have her monthly cycles.  They may be a little bit on the heavier side.  She was tested for the lupus anticoagulant.  The test came back NEGATIVE.  We did repeat her Protein S level.  Her levels came back normal.  She has not noted any leg swelling.  She has had no change in bowel or bladder habits.  Overall, her performance status is ECOG 0.  Medications:  Current Outpatient Medications:  .  albuterol (PROVENTIL HFA;VENTOLIN HFA) 108 (90 BASE) MCG/ACT inhaler, Inhale into the lungs every 6 (six) hours as needed for wheezing or shortness of breath., Disp: , Rfl:  .  Probiotic Product (PROBIOTIC DAILY PO), Take by mouth., Disp: , Rfl:  .  rivaroxaban (XARELTO) 20 MG TABS tablet, Take by mouth., Disp: , Rfl:   Current Facility-Administered Medications:  .  medroxyPROGESTERone (DEPO-PROVERA) injection 150 mg, 150 mg, Intramuscular, Q90 days, Dove, Myra C, MD, 150 mg at 10/01/17 1115  Allergies:  Allergies  Allergen Reactions  . Estrogens Other (See Comments)    Hx of PE on estrogen  . Latex     Past Medical History, Surgical history, Social history, and Family History were reviewed and updated.  Review of Systems: Review of Systems  Constitutional: Negative for appetite change, fatigue, fever and unexpected weight change.    HENT:   Negative for lump/mass, mouth sores, sore throat and trouble swallowing.   Respiratory: Negative for cough, hemoptysis and shortness of breath.   Cardiovascular: Negative for leg swelling and palpitations.  Gastrointestinal: Negative for abdominal distention, abdominal pain, blood in stool, constipation, diarrhea, nausea and vomiting.  Genitourinary: Negative for bladder incontinence, dysuria, frequency and hematuria.   Musculoskeletal: Negative for arthralgias, back pain, gait problem and myalgias.  Skin: Negative for itching and rash.  Neurological: Negative for dizziness, extremity weakness, gait problem, headaches, numbness, seizures and speech difficulty.  Hematological: Does not bruise/bleed easily.  Psychiatric/Behavioral: Negative for depression and sleep disturbance. The patient is not nervous/anxious.     Physical Exam:  weight is 173 lb 12.8 oz (78.8 kg). Her oral temperature is 98 F (36.7 C). Her blood pressure is 122/65 and her pulse is 75. Her oxygen saturation is 100%.   Wt Readings from Last 3 Encounters:  12/14/17 173 lb 12.8 oz (78.8 kg)  09/17/17 168 lb (76.2 kg)  09/15/17 167 lb (75.8 kg)    Physical Exam  Constitutional: She is oriented to person, place, and time.  HENT:  Head: Normocephalic and atraumatic.  Mouth/Throat: Oropharynx is clear and moist.  Eyes: EOM are normal. Pupils are equal, round, and reactive to light.  Neck: Normal range of motion.  Cardiovascular: Normal rate, regular rhythm and normal heart sounds.  Pulmonary/Chest: Effort normal and breath sounds normal.  Abdominal: Soft.  Bowel sounds are normal.  Musculoskeletal: Normal range of motion. She exhibits no edema, tenderness or deformity.  Lymphadenopathy:    She has no cervical adenopathy.  Neurological: She is alert and oriented to person, place, and time.  Skin: Skin is warm and dry. No rash noted. No erythema.  Psychiatric: She has a normal mood and affect. Her behavior is  normal. Judgment and thought content normal.  Vitals reviewed.    Lab Results  Component Value Date   WBC 12.0 (H) 12/14/2017   HGB 13.8 09/17/2017   HCT 42.9 12/14/2017   MCV 89.0 12/14/2017   PLT 287 12/14/2017     Chemistry      Component Value Date/Time   NA 137 07/26/2017 1224   NA 137 05/30/2017 1441   K 4.0 07/26/2017 1224   K 3.3 05/30/2017 1441   CL 103 07/26/2017 1224   CL 101 05/30/2017 1441   CO2 22 07/26/2017 1224   CO2 29 05/30/2017 1441   BUN 9 07/26/2017 1224   BUN 8 05/30/2017 1441   CREATININE 0.71 07/26/2017 1224      Component Value Date/Time   CALCIUM 9.7 07/26/2017 1224   CALCIUM 9.0 05/30/2017 1441   ALKPHOS 64 05/30/2017 1441   AST 22 07/26/2017 1224   AST 31 05/30/2017 1441   ALT 44 (H) 07/26/2017 1224   ALT 39 05/30/2017 1441   BILITOT 0.5 07/26/2017 1224   BILITOT 0.70 05/30/2017 1441         Impression and Plan: [name] is a 27 year old white female. She had a pulmonary embolism. This was diagnosed back in June.  She is on Xarelto. I suspect that she probably will need Xarelto at a therapeutic dose for one year.  We will go ahead and get her back in June 2019.  We will then put her on maintenance Xarelto at 10 mg for 1 year.  I think this would be a great idea for her.   Josph Macho, MD 2/1/201912:27 PM

## 2017-12-17 LAB — LUPUS ANTICOAGULANT PANEL
DRVVT: 62 s — AB (ref 0.0–47.0)
PTT Lupus Anticoagulant: 31.6 s (ref 0.0–51.9)

## 2017-12-17 LAB — DRVVT MIX: dRVVT Mix: 56.2 s — ABNORMAL HIGH (ref 0.0–47.0)

## 2017-12-17 LAB — DRVVT CONFIRM: dRVVT Confirm: 1.1 ratio (ref 0.8–1.2)

## 2017-12-24 ENCOUNTER — Ambulatory Visit: Payer: 59

## 2017-12-25 ENCOUNTER — Ambulatory Visit (INDEPENDENT_AMBULATORY_CARE_PROVIDER_SITE_OTHER): Payer: 59 | Admitting: *Deleted

## 2017-12-25 DIAGNOSIS — Z3042 Encounter for surveillance of injectable contraceptive: Secondary | ICD-10-CM

## 2018-01-16 ENCOUNTER — Ambulatory Visit (INDEPENDENT_AMBULATORY_CARE_PROVIDER_SITE_OTHER): Payer: 59 | Admitting: Family Medicine

## 2018-01-16 ENCOUNTER — Encounter: Payer: Self-pay | Admitting: Family Medicine

## 2018-01-16 VITALS — BP 127/79 | HR 81 | Ht 65.0 in | Wt 174.0 lb

## 2018-01-16 DIAGNOSIS — H9203 Otalgia, bilateral: Secondary | ICD-10-CM | POA: Diagnosis not present

## 2018-01-16 DIAGNOSIS — J302 Other seasonal allergic rhinitis: Secondary | ICD-10-CM

## 2018-01-16 DIAGNOSIS — Z111 Encounter for screening for respiratory tuberculosis: Secondary | ICD-10-CM | POA: Diagnosis not present

## 2018-01-16 DIAGNOSIS — I2699 Other pulmonary embolism without acute cor pulmonale: Secondary | ICD-10-CM

## 2018-01-16 DIAGNOSIS — Z23 Encounter for immunization: Secondary | ICD-10-CM | POA: Diagnosis not present

## 2018-01-16 MED ORDER — PREDNISONE 20 MG PO TABS: 40.0000 mg | ORAL_TABLET | Freq: Every day | ORAL | 0 refills | Status: DC

## 2018-01-16 MED ORDER — BECLOMETHASONE DIPROPIONATE 80 MCG/ACT NA AERS: INHALATION_SPRAY | NASAL | 99 refills | Status: DC

## 2018-01-16 NOTE — Progress Notes (Signed)
Subjective:    Patient ID: [name], female    DOB: 04-04-1991, 27 y.o.   MRN: [id number]  HPI F/U PE - seeing Dr. Myna Hidalgo and will be on more long term anticogularion.  She has a follow-up in June and at that point will go on maintenance dosing.  But otherwise she is doing really well.  Also c/o of bilat ear pain and some dental pain into her jaw for about 3 weeks.  It started out more as a sharp stabbing pain particularly in her left ear but now it is in both.  She is been having more frequent sinus headaches as well.  She will occasionally use Tylenol and that does provide some relief.  She just feels like there is a lot of pressure in clogging in her ears and jaw area.  But she does not feel like there is a lot of congestion directly in the nasal area or nasal bridge area.  He says she gets a little bit but it definitely comes and goes.  She has been using essential oils and says sometimes that also gives her temporary relief.  Occasional sneezing.  No significant itching in the throat or ears.  No fever, sweats and chills.   She says over the years she is tried multiple different nasal sprays and allergy medications and they have gotten to the point where they really do not seem to work or help.  She occasionally does a sinus rinse but does not feel super congested right over her nasal bridge right now.  Did have allergy testing and immunotherapy treatment as a child.  She does not currently have any animals.   Review of Systems  BP 127/79   Pulse 81   Ht 5\' 5"  (1.651 m)   Wt 174 lb (78.9 kg)   SpO2 100%   BMI 28.96 kg/m     Allergies  Allergen Reactions  . Estrogens Other (See Comments)    Hx of PE on estrogen  . Latex     Past Medical History:  Diagnosis Date  . Allergy   . Anxiety   . Depression     Past Surgical History:  Procedure Laterality Date  . CHOLECYSTECTOMY  08-2009    Social History   Socioeconomic History  . Marital status: Single    Spouse  name: Not on file  . Number of children: 1  . Years of education: Not on file  . Highest education level: Not on file  Social Needs  . Financial resource strain: Not on file  . Food insecurity - worry: Not on file  . Food insecurity - inability: Not on file  . Transportation needs - medical: Not on file  . Transportation needs - non-medical: Not on file  Occupational History  . Occupation: : [employer]    Comment: [employer] [place]   Tobacco Use  . Smoking status: Former Smoker    Last attempt to quit: 11/14/2007    Years since quitting: 10.1  . Smokeless tobacco: Never Used  Substance and Sexual Activity  . Alcohol use: No    Comment: once every couple of months.   . Drug use: No  . Sexual activity: Yes    Partners: Male    Birth control/protection: Patch    Comment:  HS diploma, 5 caffeine drinks daily,no regular exercise.  Other Topics Concern  . Not on file  Social History Narrative   Exercise 3-4 days  per week. 1-2 cups  Caffeine per day. .      Family History  Problem Relation Age of Onset  . Depression Mother   . Diabetes Father   . Hypertension Father   . Hyperlipidemia Father   . Depression Brother   . Colon cancer Maternal Grandfather   . Prostate cancer Maternal Grandfather   . Melanoma Maternal Grandfather   . Lung cancer Paternal Aunt        smoker     Outpatient Encounter Medications as of 01/16/2018  Medication Sig  . albuterol (PROVENTIL HFA;VENTOLIN HFA) 108 (90 BASE) MCG/ACT inhaler Inhale into the lungs every 6 (six) hours as needed for wheezing or shortness of breath.  . Probiotic Product (PROBIOTIC DAILY PO) Take by mouth.  . rivaroxaban (XARELTO) 20 MG TABS tablet Take by mouth.  . Beclomethasone Dipropionate (QNASL) 80 MCG/ACT AERS 1 spray each nostril daily  . predniSONE (DELTASONE) 20 MG tablet Take 2 tablets (40 mg total) by mouth daily with breakfast.  . [DISCONTINUED] medroxyPROGESTERone (DEPO-PROVERA) injection  150 mg    No facility-administered encounter medications on file as of 01/16/2018.          Objective:   Physical Exam  Constitutional: She is oriented to person, place, and time. She appears well-developed and well-nourished.  HENT:  Head: Normocephalic and atraumatic.  Right Ear: External ear normal.  Left Ear: External ear normal.  Nose: Nose normal.  Mouth/Throat: Oropharynx is clear and moist.  TMs and canals are clear. Small amt of fluid behind the rightTM.    Eyes: Conjunctivae and EOM are normal. Pupils are equal, round, and reactive to light.  Neck: Neck supple. No thyromegaly present.  Cardiovascular: Normal rate, regular rhythm and normal heart sounds.  Pulmonary/Chest: Effort normal and breath sounds normal. She has no wheezes.  Lymphadenopathy:    She has no cervical adenopathy.  Neurological: She is alert and oriented to person, place, and time.  Skin: Skin is warm and dry.  Psychiatric: She has a normal mood and affect.          Assessment & Plan:  Bilateral ear pain-discussed diagnosis most likely to be TMJ versus allergic rhinitis.  Recommend a trial of a nasal steroid spray and antihistamine initially and if not improving that it could be more related to TMJ.  Her dentist in fact has recommended that she get fitted for a mouth guard to help with this.  She already avoids chewing gum.  Allergic rhinitis-she did do allergy shots as a child.  I think it would be prudent at this point to go ahead and refer [facility] to [facility] for further evaluation and retesting and possible immunotherapy.  PE -following with Dr. Myna Hidalgo who is managing her anticoagulation.   Declined flu vaccine.

## 2018-01-16 NOTE — Patient Instructions (Signed)
Try add Allegra 180mg  daily to your regimen.   We will refer you to [facility].

## 2018-01-17 ENCOUNTER — Telehealth: Payer: Self-pay | Admitting: Family Medicine

## 2018-01-17 NOTE — Telephone Encounter (Signed)
Call pt: Qnasl not covered on insurance plan.  She may have to call and see what is covered.

## 2018-01-18 ENCOUNTER — Ambulatory Visit (INDEPENDENT_AMBULATORY_CARE_PROVIDER_SITE_OTHER): Payer: 59 | Admitting: Family Medicine

## 2018-01-18 VITALS — BP 119/67 | HR 68

## 2018-01-18 DIAGNOSIS — Z111 Encounter for screening for respiratory tuberculosis: Secondary | ICD-10-CM | POA: Diagnosis not present

## 2018-01-18 LAB — TB SKIN TEST
Induration: 0 mm
TB SKIN TEST: NEGATIVE

## 2018-01-18 NOTE — Telephone Encounter (Signed)
lvm advising pt of recommendations. Asked that she call back with information once she finds out.Heath Gold, CMA

## 2018-01-18 NOTE — Progress Notes (Signed)
Pt was seen in office today for PPD read. Test was negative. Form completed, and printed copy provided. No further questions at this time.

## 2018-01-18 NOTE — Progress Notes (Signed)
Agree with documentation as above.   Jayleon Mcfarlane, MD  

## 2018-03-18 ENCOUNTER — Telehealth: Payer: Self-pay

## 2018-03-18 DIAGNOSIS — IMO0001 Reserved for inherently not codable concepts without codable children: Secondary | ICD-10-CM

## 2018-03-18 MED ORDER — MEDROXYPROGESTERONE ACETATE 150 MG/ML IM SUSP: 150.0000 mg | INTRAMUSCULAR | 0 refills | Status: DC

## 2018-03-18 NOTE — Telephone Encounter (Signed)
PT needs refill on Depo. Pt needs an annual. Will send in one refill but pt needs to schedule annual appt. Pt is aware.

## 2018-03-19 ENCOUNTER — Ambulatory Visit (INDEPENDENT_AMBULATORY_CARE_PROVIDER_SITE_OTHER): Payer: 59

## 2018-03-19 DIAGNOSIS — IMO0001 Reserved for inherently not codable concepts without codable children: Secondary | ICD-10-CM

## 2018-03-19 DIAGNOSIS — Z3042 Encounter for surveillance of injectable contraceptive: Secondary | ICD-10-CM | POA: Diagnosis not present

## 2018-03-19 MED ORDER — MEDROXYPROGESTERONE ACETATE 150 MG/ML IM SUSP
150.0000 mg | Freq: Once | INTRAMUSCULAR | Status: AC
  Administered 2018-03-19: 150 mg via INTRAMUSCULAR

## 2018-04-15 ENCOUNTER — Encounter: Payer: Self-pay | Admitting: Hematology & Oncology

## 2018-04-15 ENCOUNTER — Inpatient Hospital Stay: Payer: 59 | Attending: Hematology & Oncology | Admitting: Hematology & Oncology

## 2018-04-15 ENCOUNTER — Other Ambulatory Visit: Payer: Self-pay

## 2018-04-15 ENCOUNTER — Inpatient Hospital Stay: Payer: 59

## 2018-04-15 VITALS — BP 118/77 | HR 77 | Temp 98.9°F | Resp 18 | Wt 177.0 lb

## 2018-04-15 DIAGNOSIS — I2699 Other pulmonary embolism without acute cor pulmonale: Secondary | ICD-10-CM | POA: Diagnosis not present

## 2018-04-15 DIAGNOSIS — Z7901 Long term (current) use of anticoagulants: Secondary | ICD-10-CM | POA: Diagnosis not present

## 2018-04-15 DIAGNOSIS — R0789 Other chest pain: Secondary | ICD-10-CM | POA: Insufficient documentation

## 2018-04-15 LAB — CBC WITH DIFFERENTIAL (CANCER CENTER ONLY)
Basophils Absolute: 0 10*3/uL (ref 0.0–0.1)
Basophils Relative: 0 %
Eosinophils Absolute: 0.3 10*3/uL (ref 0.0–0.5)
Eosinophils Relative: 4 %
HEMATOCRIT: 42.6 % (ref 34.8–46.6)
HEMOGLOBIN: 14.3 g/dL (ref 11.6–15.9)
LYMPHS ABS: 2.7 10*3/uL (ref 0.9–3.3)
Lymphocytes Relative: 40 %
MCH: 30.1 pg (ref 26.0–34.0)
MCHC: 33.6 g/dL (ref 32.0–36.0)
MCV: 89.7 fL (ref 81.0–101.0)
MONOS PCT: 5 %
Monocytes Absolute: 0.4 10*3/uL (ref 0.1–0.9)
NEUTROS ABS: 3.4 10*3/uL (ref 1.5–6.5)
NEUTROS PCT: 51 %
Platelet Count: 257 10*3/uL (ref 145–400)
RBC: 4.75 MIL/uL (ref 3.70–5.32)
RDW: 13.1 % (ref 11.1–15.7)
WBC Count: 6.7 10*3/uL (ref 3.9–10.0)

## 2018-04-15 LAB — CMP (CANCER CENTER ONLY)
ALK PHOS: 57 U/L (ref 40–150)
ALT: 20 U/L (ref 0–55)
ANION GAP: 8 (ref 3–11)
AST: 20 U/L (ref 5–34)
Albumin: 3.9 g/dL (ref 3.5–5.0)
BUN: 7 mg/dL (ref 7–26)
CALCIUM: 9.5 mg/dL (ref 8.4–10.4)
CO2: 24 mmol/L (ref 22–29)
CREATININE: 0.78 mg/dL (ref 0.60–1.10)
Chloride: 108 mmol/L (ref 98–109)
GFR, Estimated: >60 mL/min (ref >60)
Glucose, Bld: 94 mg/dL (ref 70–140)
Potassium: 4.5 mmol/L (ref 3.5–5.1)
Sodium: 140 mmol/L (ref 136–145)
TOTAL PROTEIN: 7.3 g/dL (ref 6.4–8.3)
Total Bilirubin: 0.6 mg/dL (ref 0.2–1.2)

## 2018-04-15 MED ORDER — RIVAROXABAN 10 MG PO TABS: 10.0000 mg | ORAL_TABLET | Freq: Every day | ORAL | 11 refills | Status: DC

## 2018-04-15 NOTE — Progress Notes (Signed)
Hematology and Oncology Follow Up Visit  [name] [id number] 18-Apr-1991 27 y.o. 04/15/2018   Principle Diagnosis:   Pulmonary embolism of the lingula artery  Positive lupus anticoagulant - possibly transient  Protein S deficiency (?)  Current Therapy:    Xarelto 20 mg by mouth daily - complete 1 year in June 2019  Xarelto 10 mg po q day - 1 year to complete in 04/2019     Interim History:  [name] is back for follow-up.  She is doing well.  She is doing okay.  She is been working quite a lot.  She did have a good birthday a couple weeks ago.  Her daughter is doing well.  She is done well with the Xarelto 20 mg.  I will now get her on a maintenance dose of 10 mg daily.  We last checked her Protein S level, it came back normal.  She has had some issues with bleeding from her cycles.  She is on different medications to try to help this.  She now is on Depo-Provera and this is caused her to gain weight.  She has had no cough or shortness of breath.  She has some intermittent chest wall pain on the left side.  Overall, her performance status is ECOG 0.  Medications:  Current Outpatient Medications:  .  albuterol (PROVENTIL HFA;VENTOLIN HFA) 108 (90 BASE) MCG/ACT inhaler, Inhale into the lungs every 6 (six) hours as needed for wheezing or shortness of breath., Disp: , Rfl:  .  medroxyPROGESTERone (DEPO-PROVERA) 150 MG/ML injection, Inject 1 mL (150 mg total) into the muscle every 3 (three) months., Disp: 1 mL, Rfl: 0 .  Probiotic Product (PROBIOTIC DAILY PO), Take by mouth., Disp: , Rfl:  .  rivaroxaban (XARELTO) 20 MG TABS tablet, Take by mouth., Disp: , Rfl:   Allergies:  Allergies  Allergen Reactions  . Latex Rash  . Estrogens Other (See Comments)    Hx of PE on estrogen    Past Medical History, Surgical history, Social history, and Family History were reviewed and updated.  Review of Systems: Review of Systems  Constitutional: Negative for appetite  change, fatigue, fever and unexpected weight change.  HENT:   Negative for lump/mass, mouth sores, sore throat and trouble swallowing.   Respiratory: Negative for cough, hemoptysis and shortness of breath.   Cardiovascular: Negative for leg swelling and palpitations.  Gastrointestinal: Negative for abdominal distention, abdominal pain, blood in stool, constipation, diarrhea, nausea and vomiting.  Genitourinary: Negative for bladder incontinence, dysuria, frequency and hematuria.   Musculoskeletal: Negative for arthralgias, back pain, gait problem and myalgias.  Skin: Negative for itching and rash.  Neurological: Negative for dizziness, extremity weakness, gait problem, headaches, numbness, seizures and speech difficulty.  Hematological: Does not bruise/bleed easily.  Psychiatric/Behavioral: Negative for depression and sleep disturbance. The patient is not nervous/anxious.     Physical Exam:  weight is 177 lb (80.3 kg). Her oral temperature is 98.9 F (37.2 C). Her blood pressure is 118/77 and her pulse is 77. Her respiration is 18 and oxygen saturation is 100%.   Wt Readings from Last 3 Encounters:  04/15/18 177 lb (80.3 kg)  01/16/18 174 lb (78.9 kg)  12/14/17 173 lb 12.8 oz (78.8 kg)    Physical Exam  Constitutional: She is oriented to person, place, and time.  HENT:  Head: Normocephalic and atraumatic.  Mouth/Throat: Oropharynx is clear and moist.  Eyes: Pupils are equal, round, and reactive to light. EOM are normal.  Neck: Normal range of motion.  Cardiovascular: Normal rate, regular rhythm and normal heart sounds.  Pulmonary/Chest: Effort normal and breath sounds normal.  Abdominal: Soft. Bowel sounds are normal.  Musculoskeletal: Normal range of motion. She exhibits no edema, tenderness or deformity.  Lymphadenopathy:    She has no cervical adenopathy.  Neurological: She is alert and oriented to person, place, and time.  Skin: Skin is warm and dry. No rash noted. No  erythema.  Psychiatric: She has a normal mood and affect. Her behavior is normal. Judgment and thought content normal.  Vitals reviewed.    Lab Results  Component Value Date   WBC 6.7 04/15/2018   HGB 14.3 04/15/2018   HCT 42.6 04/15/2018   MCV 89.7 04/15/2018   PLT 257 04/15/2018     Chemistry      Component Value Date/Time   NA 140 12/14/2017 1051   NA 137 05/30/2017 1441   K 3.9 12/14/2017 1051   K 3.3 05/30/2017 1441   CL 107 12/14/2017 1051   CL 101 05/30/2017 1441   CO2 22 12/14/2017 1051   CO2 29 05/30/2017 1441   BUN 8 12/14/2017 1051   BUN 8 05/30/2017 1441   CREATININE 0.79 12/14/2017 1051   CREATININE 0.71 07/26/2017 1224      Component Value Date/Time   CALCIUM 9.5 12/14/2017 1051   CALCIUM 9.0 05/30/2017 1441   ALKPHOS 64 12/14/2017 1051   ALKPHOS 64 05/30/2017 1441   AST 20 12/14/2017 1051   ALT 25 12/14/2017 1051   ALT 39 05/30/2017 1441   BILITOT 0.5 12/14/2017 1051         Impression and Plan: Ms. Colan is a 27 year old white female. She had a pulmonary embolism. This was diagnosed back in June.  We will now get her on 10 mg maintenance of Xarelto.  I think this would be very reasonable.  We will check her lupus anticoagulant again and also her protein S level.  When we last checked in September 2018, the protein S level was normal.  I think that we can now get her back in 6 months.    Michaela Macho, MD 6/3/201910:31 AM

## 2018-06-11 ENCOUNTER — Encounter: Payer: Self-pay | Admitting: Family Medicine

## 2018-06-11 ENCOUNTER — Ambulatory Visit: Payer: BLUE CROSS/BLUE SHIELD | Admitting: Family Medicine

## 2018-06-11 ENCOUNTER — Ambulatory Visit (INDEPENDENT_AMBULATORY_CARE_PROVIDER_SITE_OTHER): Payer: BLUE CROSS/BLUE SHIELD

## 2018-06-11 VITALS — BP 126/77 | HR 77 | Ht 65.0 in | Wt 179.0 lb

## 2018-06-11 DIAGNOSIS — M533 Sacrococcygeal disorders, not elsewhere classified: Secondary | ICD-10-CM

## 2018-06-11 DIAGNOSIS — W19XXXA Unspecified fall, initial encounter: Secondary | ICD-10-CM | POA: Diagnosis not present

## 2018-06-11 DIAGNOSIS — M545 Low back pain, unspecified: Secondary | ICD-10-CM

## 2018-06-11 DIAGNOSIS — S322XXA Fracture of coccyx, initial encounter for closed fracture: Secondary | ICD-10-CM | POA: Diagnosis not present

## 2018-06-11 MED ORDER — HYDROCODONE-ACETAMINOPHEN 5-325 MG PO TABS: 1.0000 | ORAL_TABLET | Freq: Four times a day (QID) | ORAL | 0 refills | Status: DC | PRN

## 2018-06-11 NOTE — Progress Notes (Signed)
[name] is a 27 y.o. female who presents to [facility] today for back pain.   She has been having pain in her tailbone following a fall down some stairs on Friday. She says she fell onto her bottom on the top stair and slid down the entire staircase hitting her tailbone on every stair on the way down. She endorses pain over her tailbone but denies shooting or burning pain in her legs. She denies bowel/bladder changes. She denies numbness/tingling. She denies pain in her lumbar area or other areas of her spine. Of note, she has a history significant for pulmonary embolism and is on Xarelto. She denies hitting her head when she fell and says she has not had any confusion or headaches.    ROS:  As above  Exam:  BP 126/77   Pulse 77   Ht 5\' 5"  (1.651 m)   Wt 179 lb (81.2 kg)   BMI 29.79 kg/m  General: Well Developed, well nourished, and in no acute distress.  Neuro/Psych: Alert and oriented x3, extra-ocular muscles intact, able to move all 4 extremities, sensation grossly intact. Skin: Warm and dry, no rashes noted.  Respiratory: Not using accessory muscles, speaking in full sentences, trachea midline.  Cardiovascular: Pulses palpable, no extremity edema. Abdomen: Does not appear distended. Sacral Area: tender to palpation over the coccygeal midline  Normal hip and leg motion.  Normal lower extremity strength.  Normal gait.    Lab and Radiology Results X-ray sacrum coccyx images personally independent reviewed.   X-ray shows a mildly displaced fracture of the coccyx.  No other injuries present.  Awaiting formal radiology review.   Assessment and Plan: 27 y.o. female with coccygeal pain.  Fracture present on x-ray.  Radiology review pending.  The plan will be to rest the injury and use a pillow with a hole in the middle to sit on until the pain resolves.  She also will take Norco for the pain. She was encouraged to take miralax to  get ahead of the likely constipation from the Norco as bowel movements will be especially difficult with the coccygeal injury.  She was advised that there will be a few weeks of pain and she should come back and see 26 if the pain is not improving.   Patient researched [facility] Controlled Substance Reporting System.    Orders Placed This Encounter  Procedures  . DG Sacrum/Coccyx    Standing Status:   Future    Number of Occurrences:   1    Standing Expiration Date:   08/13/2019    Order Specific Question:   Reason for Exam (SYMPTOM  OR DIAGNOSIS REQUIRED)    Answer:   eval coccix pain    Order Specific Question:   Is patient pregnant?    Answer:   No    Order Specific Question:   Preferred imaging location?    Answer:   08/15/2019    Order Specific Question:   Radiology Contrast Protocol - do NOT remove file path    Answer:   [email or web address]   Meds ordered this encounter  Medications  . HYDROcodone-acetaminophen (NORCO/VICODIN) 5-325 MG tablet    Sig: Take 1 tablet by mouth every 6 (six) hours as needed.    Dispense:  15 tablet    Refill:  0    Historical information moved to improve visibility of documentation.  Past Medical History:  Diagnosis Date  . Allergy   . Anxiety   .  Depression   . PE (pulmonary thromboembolism) (HCC) 05/02/2017   Past Surgical History:  Procedure Laterality Date  . CHOLECYSTECTOMY  08-2009   Social History   Tobacco Use  . Smoking status: Former Smoker    Last attempt to quit: 11/14/2007    Years since quitting: 10.5  . Smokeless tobacco: Never Used  Substance Use Topics  . Alcohol use: No    Comment: once every couple of months.    family history includes Colon cancer in her maternal grandfather; Depression in her brother and mother; Diabetes in her father; Hyperlipidemia in her father; Hypertension in her father; Lung cancer in her paternal aunt; Melanoma in her maternal  grandfather; Prostate cancer in her maternal grandfather.  Medications: Current Outpatient Medications  Medication Sig Dispense Refill  . albuterol (PROVENTIL HFA;VENTOLIN HFA) 108 (90 BASE) MCG/ACT inhaler Inhale into the lungs every 6 (six) hours as needed for wheezing or shortness of breath.    . medroxyPROGESTERone (DEPO-PROVERA) 150 MG/ML injection Inject 1 mL (150 mg total) into the muscle every 3 (three) months. 1 mL 0  . Probiotic Product (PROBIOTIC DAILY PO) Take by mouth.    . rivaroxaban (XARELTO) 10 MG TABS tablet Take 1 tablet (10 mg total) by mouth daily with supper. 30 tablet 11  . HYDROcodone-acetaminophen (NORCO/VICODIN) 5-325 MG tablet Take 1 tablet by mouth every 6 (six) hours as needed. 15 tablet 0   No current facility-administered medications for this visit.    Allergies  Allergen Reactions  . Latex Rash  . Estrogens Other (See Comments)    Hx of PE on estrogen      Discussed warning signs or symptoms. Please see discharge instructions. Patient expresses understanding. I personally was present and performed or re-performed the history, physical exam and medical decision-making activities of this service and have verified that the service and findings are accurately documented in the student's note. ___________________________________________ Clementeen Graham M.D., ABFM., CAQSM. Primary Care and Sports Medicine Adjunct Instructor of Family Medicine  [employer]

## 2018-06-11 NOTE — Patient Instructions (Addendum)
Thank you for coming in today. Use norco sparingly Use a cushion with a hole in the middle.  Use ice as needed.   Let me know you need a note.   Recheck as needed.    Tailbone Injury The tailbone (coccyx) is the small bone at the lower end of the spine. A tailbone injury may involve stretched ligaments, bruising, or a broken bone (fracture). Tailbone injuries can be painful, and some may take a long time to heal. What are the causes? This condition is often caused by falling and landing on the tailbone. Other causes include:  Repeated strain or friction from actions such as rowing and bicycling.  Childbirth.  In some cases, the cause may not be known. What increases the risk? This condition is more common in women than in men. What are the signs or symptoms? Symptoms of this condition include:  Pain in the lower back, especially when sitting.  Pain or difficulty when standing up from a sitting position.  Bruising in the tailbone area.  Painful bowel movements.  In women, pain during intercourse.  How is this diagnosed? This condition may be diagnosed based on your symptoms and a physical exam. X-rays may be taken if a fracture is suspected. You may also have other tests, such as a CT scan or MRI. How is this treated? This condition may be treated with medicines to help relieve your pain. Most tailbone injuries heal on their own in 4-6 weeks. However, recovery time may be longer if the injury involves a fracture. Follow these instructions at home:  Take medicines only as directed by your health care provider.  If directed, apply ice to the injured area: ? Put ice in a plastic bag. ? Place a towel between your skin and the bag. ? Leave the ice on for 20 minutes, 2-3 times per day for the first 1-2 days.  Sit on a large, rubber or inflated ring or cushion to ease your pain. Lean forward when you are sitting to help decrease discomfort.  Avoid sitting for long periods of  time.  Increase your activity as the pain allows. Perform any exercises that are recommended by your health care provider or physical therapist.  If you have pain during bowel movements, use stool softeners as directed by your health care provider.  Eat a diet that includes plenty of fiber to help prevent constipation.  Keep all follow-up visits as directed by your health care provider. This is important. How is this prevented? Wear appropriate padding and sports gear when bicycling and rowing. This can help to prevent developing an injury that is caused by repeated strain or friction. Contact a health care provider if:  Your pain becomes worse.  Your bowel movements cause a great deal of discomfort.  You are unable to have a bowel movement.  You have uncontrolled urine loss (urinary incontinence).  You have a fever. This information is not intended to replace advice given to you by your health care provider. Make sure you discuss any questions you have with your health care provider. Document Released: 10/27/2000 Document Revised: 06/29/2016 Document Reviewed: 10/26/2014 Elsevier Interactive Patient Education  [employer].

## 2018-07-09 ENCOUNTER — Telehealth: Payer: Self-pay | Admitting: *Deleted

## 2018-07-09 NOTE — Telephone Encounter (Signed)
Spoke w/pt and she stated that she needs labs for her HRA screening data form to be completed.

## 2018-07-10 ENCOUNTER — Ambulatory Visit (INDEPENDENT_AMBULATORY_CARE_PROVIDER_SITE_OTHER): Payer: BLUE CROSS/BLUE SHIELD | Admitting: Family Medicine

## 2018-07-10 ENCOUNTER — Encounter: Payer: Self-pay | Admitting: Family Medicine

## 2018-07-10 VITALS — BP 126/70 | HR 69 | Temp 97.9°F | Ht 65.0 in | Wt 178.6 lb

## 2018-07-10 DIAGNOSIS — Z0000 Encounter for general adult medical examination without abnormal findings: Secondary | ICD-10-CM

## 2018-07-10 DIAGNOSIS — R7989 Other specified abnormal findings of blood chemistry: Secondary | ICD-10-CM | POA: Diagnosis not present

## 2018-07-10 NOTE — Progress Notes (Signed)
Subjective:     [name] is a 27 y.o. female and is here for a comprehensive physical exam. The patient reports no problems.   Hx of PE - she is on now on maintainence Xarelto for 1 year.  She is following with hematology every 6 months, Dr. Arlan Organ.   Social History   Socioeconomic History  . Marital status: Single    Spouse name: Not on file  . Number of children: 1  . Years of education: Not on file  . Highest education level: Not on file  Occupational History  . Occupation: Field seismologist: [employer]    Comment: [place]   Social Needs  . Financial resource strain: Not on file  . Food insecurity:    Worry: Not on file    Inability: Not on file  . Transportation needs:    Medical: Not on file    Non-medical: Not on file  Tobacco Use  . Smoking status: Former Smoker    Last attempt to quit: 11/14/2007    Years since quitting: 10.6  . Smokeless tobacco: Never Used  Substance and Sexual Activity  . Alcohol use: No    Comment: once every couple of months.   . Drug use: No  . Sexual activity: Yes    Partners: Male    Birth control/protection: Patch    Comment:  HS diploma, 5 caffeine drinks daily,no regular exercise.  Lifestyle  . Physical activity:    Days per week: Not on file    Minutes per session: Not on file  . Stress: Not on file  Relationships  . Social connections:    Talks on phone: Not on file    Gets together: Not on file    Attends religious service: Not on file    Active member of club or organization: Not on file    Attends meetings of clubs or organizations: Not on file    Relationship status: Not on file  . Intimate partner violence:    Fear of current or ex partner: Not on file    Emotionally abused: Not on file    Physically abused: Not on file    Forced sexual activity: Not on file  Other Topics Concern  . Not on file  Social History Narrative   Exercise 3-4 days per week. 1-2 cups  Caffeine per day. Marland Kitchen      Health Maintenance  Topic Date Due  . INFLUENZA VACCINE  12/14/2018 (Originally 06/13/2018)  . PAP SMEAR  02/16/2020  . TETANUS/TDAP  01/17/2028  . HIV Screening  Completed    The following portions of the patient's history were reviewed and updated as appropriate: allergies, current medications, past family history, past medical history, past social history, past surgical history and problem list.  Review of Systems A comprehensive review of systems was negative.   Objective:    BP 126/70   Pulse 69   Temp 97.9 F (36.6 C) (Oral)   Ht 5\' 5"  (1.651 m)   Wt 178 lb 9.6 oz (81 kg)   SpO2 100%   BMI 29.72 kg/m  General appearance: alert, cooperative and appears stated age Head: Normocephalic, without obvious abnormality, atraumatic Eyes: conj clear, EOMI, PEERLA Ears: normal TM's and external ear canals both ears Nose: Nares normal. Septum midline. Mucosa normal. No drainage or sinus tenderness. Throat: lips, mucosa, and tongue normal; teeth and gums normal Neck: no adenopathy, no carotid bruit, no JVD, supple,  symmetrical, trachea midline and thyroid not enlarged, symmetric, no tenderness/mass/nodules Back: symmetric, no curvature. ROM normal. No CVA tenderness. Lungs: clear to auscultation bilaterally Heart: regular rate and rhythm, S1, S2 normal, no murmur, click, rub or gallop. No carotid bruits. CP pulse in feet bilat is normal.  Abdomen: soft, non-tender; bowel sounds normal; no masses,  no organomegaly Extremities: extremities normal, atraumatic, no cyanosis or edema Pulses: 2+ and symmetric Skin: Skin color, texture, turgor normal. No rashes or lesions Lymph nodes: Cervical adenopathy: nl and Supraclavicular adenopathy: nl Neurologic: Alert and oriented X 3, normal strength and tone. Normal symmetric reflexes. Normal coordination and gait    Assessment:    Healthy female exam.      Plan:     See After Visit Summary for Counseling Recommendations   Keep up a  regular exercise program and make sure you are eating a healthy diet Try to eat 4 servings of dairy a day, or if you are lactose intolerant take a calcium with vitamin D daily.  Your vaccines are up to date.  Will complete form for work once labs are back.

## 2018-07-10 NOTE — Progress Notes (Deleted)
   Subjective:    Patient ID: Georganna Skeans, female    DOB: 10/29/1991, 27 y.o.   MRN: [id number]  HPI 27 year old female is here today.  She would like for [place] to evaluate her blood pressure.   Hx of PE - she is on now on maintainence Xarelto for 1 year.  She is following with hematology every 6 months, Dr. Arlan Organ.   Review of Systems     Objective:   Physical Exam  Constitutional: She is oriented to person, place, and time. She appears well-developed and well-nourished.  HENT:  Head: Normocephalic and atraumatic.  Cardiovascular: Normal rate, regular rhythm and normal heart sounds.  Pulmonary/Chest: Effort normal and breath sounds normal.  Neurological: She is alert and oriented to person, place, and time.  Skin: Skin is warm and dry.  Psychiatric: She has a normal mood and affect. Her behavior is normal.          Assessment & Plan:

## 2018-07-10 NOTE — Patient Instructions (Signed)
Preventive Care 18-39 Years, Female Preventive care refers to lifestyle choices and visits with your health care provider that can promote health and wellness. What does preventive care include?  A yearly physical exam. This is also called an annual well check.  Dental exams once or twice a year.  Routine eye exams. Ask your health care provider how often you should have your eyes checked.  Personal lifestyle choices, including: ? Daily care of your teeth and gums. ? Regular physical activity. ? Eating a healthy diet. ? Avoiding tobacco and drug use. ? Limiting alcohol use. ? Practicing safe sex. ? Taking vitamin and mineral supplements as recommended by your health care provider. What happens during an annual well check? The services and screenings done by your health care provider during your annual well check will depend on your age, overall health, lifestyle risk factors, and family history of disease. Counseling Your health care provider may ask you questions about your:  Alcohol use.  Tobacco use.  Drug use.  Emotional well-being.  Home and relationship well-being.  Sexual activity.  Eating habits.  Work and work Statistician.  Method of birth control.  Menstrual cycle.  Pregnancy history.  Screening You may have the following tests or measurements:  Height, weight, and BMI.  Diabetes screening. This is done by checking your blood sugar (glucose) after you have not eaten for a while (fasting).  Blood pressure.  Lipid and cholesterol levels. These may be checked every 5 years starting at age 38.  Skin check.  Hepatitis C blood test.  Hepatitis B blood test.  Sexually transmitted disease (STD) testing.  BRCA-related cancer screening. This may be done if you have a family history of breast, ovarian, tubal, or peritoneal cancers.  Pelvic exam and Pap test. This may be done every 3 years starting at age 38. Starting at age 30, this may be done  every 5 years if you have a Pap test in combination with an HPV test.  Discuss your test results, treatment options, and if necessary, the need for more tests with your health care provider. Vaccines Your health care provider may recommend certain vaccines, such as:  Influenza vaccine. This is recommended every year.  Tetanus, diphtheria, and acellular pertussis (Tdap, Td) vaccine. You may need a Td booster every 10 years.  Varicella vaccine. You may need this if you have not been vaccinated.  HPV vaccine. If you are 39 or younger, you may need three doses over 6 months.  Measles, mumps, and rubella (MMR) vaccine. You may need at least one dose of MMR. You may also need a second dose.  Pneumococcal 13-valent conjugate (PCV13) vaccine. You may need this if you have certain conditions and were not previously vaccinated.  Pneumococcal polysaccharide (PPSV23) vaccine. You may need one or two doses if you smoke cigarettes or if you have certain conditions.  Meningococcal vaccine. One dose is recommended if you are age 68-21 years and a first-year college student living in a residence hall, or if you have one of several medical conditions. You may also need additional booster doses.  Hepatitis A vaccine. You may need this if you have certain conditions or if you travel or work in places where you may be exposed to hepatitis A.  Hepatitis B vaccine. You may need this if you have certain conditions or if you travel or work in places where you may be exposed to hepatitis B.  Haemophilus influenzae type b (Hib) vaccine. You may need this  if you have certain risk factors.  Talk to your health care provider about which screenings and vaccines you need and how often you need them. This information is not intended to replace advice given to you by your health care provider. Make sure you discuss any questions you have with your health care provider. Document Released: 12/26/2001 Document Revised:  07/19/2016 Document Reviewed: 08/31/2015 Elsevier Interactive Patient Education  2018 Elsevier Inc.  

## 2018-07-11 LAB — LIPID PANEL
CHOL/HDL RATIO: 3.1 (calc) (ref <5.0)
Cholesterol: 148 mg/dL (ref <200)
HDL: 48 mg/dL — AB (ref >50)
LDL Cholesterol (Calc): 82 mg/dL (calc)
NON-HDL CHOLESTEROL (CALC): 100 mg/dL (ref <130)
Triglycerides: 89 mg/dL (ref <150)

## 2018-07-11 LAB — COMPLETE METABOLIC PANEL WITH GFR
AG RATIO: 1.5 (calc) (ref 1.0–2.5)
ALT: 18 U/L (ref 6–29)
AST: 13 U/L (ref 10–30)
Albumin: 4.4 g/dL (ref 3.6–5.1)
Alkaline phosphatase (APISO): 57 U/L (ref 33–115)
BILIRUBIN TOTAL: 0.4 mg/dL (ref 0.2–1.2)
BUN: 9 mg/dL (ref 7–25)
CALCIUM: 9.3 mg/dL (ref 8.6–10.2)
CHLORIDE: 103 mmol/L (ref 98–110)
CO2: 24 mmol/L (ref 20–32)
Creat: 0.73 mg/dL (ref 0.50–1.10)
GFR, EST NON AFRICAN AMERICAN: 112 mL/min/{1.73_m2} (ref >=60)
GFR, Est African American: 130 mL/min/{1.73_m2} (ref >=60)
GLUCOSE: 82 mg/dL (ref 65–99)
Globulin: 3 g/dL (calc) (ref 1.9–3.7)
POTASSIUM: 3.9 mmol/L (ref 3.5–5.3)
Sodium: 137 mmol/L (ref 135–146)
Total Protein: 7.4 g/dL (ref 6.1–8.1)

## 2018-07-11 LAB — CBC
HEMATOCRIT: 43.9 % (ref 35.0–45.0)
HEMOGLOBIN: 14.6 g/dL (ref 11.7–15.5)
MCH: 29.7 pg (ref 27.0–33.0)
MCHC: 33.3 g/dL (ref 32.0–36.0)
MCV: 89.4 fL (ref 80.0–100.0)
MPV: 10.1 fL (ref 7.5–12.5)
Platelets: 299 10*3/uL (ref 140–400)
RBC: 4.91 10*6/uL (ref 3.80–5.10)
RDW: 12.2 % (ref 11.0–15.0)
WBC: 10.9 10*3/uL — AB (ref 3.8–10.8)

## 2018-07-11 LAB — HEMOGLOBIN A1C
HEMOGLOBIN A1C: 5.1 %{Hb} (ref <5.7)
Mean Plasma Glucose: 100 (calc)
eAG (mmol/L): 5.5 (calc)

## 2018-10-14 ENCOUNTER — Inpatient Hospital Stay: Payer: BLUE CROSS/BLUE SHIELD | Attending: Hematology & Oncology | Admitting: Hematology & Oncology

## 2018-10-14 ENCOUNTER — Inpatient Hospital Stay: Payer: BLUE CROSS/BLUE SHIELD

## 2019-01-10 ENCOUNTER — Ambulatory Visit (INDEPENDENT_AMBULATORY_CARE_PROVIDER_SITE_OTHER): Payer: BLUE CROSS/BLUE SHIELD | Admitting: Sports Medicine

## 2019-01-10 ENCOUNTER — Ambulatory Visit (INDEPENDENT_AMBULATORY_CARE_PROVIDER_SITE_OTHER): Payer: BLUE CROSS/BLUE SHIELD

## 2019-01-10 ENCOUNTER — Encounter: Payer: Self-pay | Admitting: Sports Medicine

## 2019-01-10 DIAGNOSIS — X58XXXA Exposure to other specified factors, initial encounter: Secondary | ICD-10-CM

## 2019-01-10 DIAGNOSIS — S60221A Contusion of right hand, initial encounter: Secondary | ICD-10-CM

## 2019-01-10 DIAGNOSIS — M7989 Other specified soft tissue disorders: Secondary | ICD-10-CM | POA: Insufficient documentation

## 2019-01-10 MED ORDER — MELOXICAM 15 MG PO TABS: ORAL_TABLET | ORAL | 3 refills | Status: DC

## 2019-01-10 NOTE — Assessment & Plan Note (Addendum)
Dorsal contusion approximately 2 days ago. Good motion, good strength, neurovascularly intact. The hand was strapped with a compressive dressing, getting x-rays, she will use arthritis strength Tylenol, no NSAIDs as she is on Xarelto. Return to see me as needed.

## 2019-01-10 NOTE — Progress Notes (Signed)
Subjective:    CC: Right hand injury  HPI: Couple of days ago this pleasant 28 year old female who banged her hand on a piano, she had immediate pain, swelling, bruising.  It has improved slightly, symptoms are moderate, improving.  Localized over the dorsum of the hand.  No paresthesias into the fingers.  Moderately good function.  I reviewed the past medical history, family history, social history, surgical history, and allergies today and no changes were needed.  Please see the problem list section below in epic for further details.  Past Medical History: Past Medical History:  Diagnosis Date  . Allergy   . Anxiety   . Depression   . PE (pulmonary thromboembolism) (HCC) 05/02/2017   Past Surgical History: Past Surgical History:  Procedure Laterality Date  . CHOLECYSTECTOMY  08-2009   Social History: Social History   Socioeconomic History  . Marital status: Single    Spouse name: Not on file  . Number of children: 1  . Years of education: Not on file  . Highest education level: Not on file  Occupational History  . Occupation: Field seismologistpolice dispatcher    Employer: [employer]    Comment: [place]   Social Needs  . Financial resource strain: Not on file  . Food insecurity:    Worry: Not on file    Inability: Not on file  . Transportation needs:    Medical: Not on file    Non-medical: Not on file  Tobacco Use  . Smoking status: Former Smoker    Last attempt to quit: 11/14/2007    Years since quitting: 11.1  . Smokeless tobacco: Never Used  Substance and Sexual Activity  . Alcohol use: No    Comment: once every couple of months.   . Drug use: No  . Sexual activity: Yes    Partners: Male    Birth control/protection: Patch    Comment:  HS diploma, 5 caffeine drinks daily,no regular exercise.  Lifestyle  . Physical activity:    Days per week: Not on file    Minutes per session: Not on file  . Stress: Not on file  Relationships  . Social connections:    Talks  on phone: Not on file    Gets together: Not on file    Attends religious service: Not on file    Active member of club or organization: Not on file    Attends meetings of clubs or organizations: Not on file    Relationship status: Not on file  Other Topics Concern  . Not on file  Social History Narrative   Exercise 3-4 days per week. 1-2 cups  Caffeine per day. .     Family History: Family History  Problem Relation Age of Onset  . Depression Mother   . Diabetes Father   . Hypertension Father   . Hyperlipidemia Father   . Depression Brother   . Colon cancer Maternal Grandfather   . Prostate cancer Maternal Grandfather   . Melanoma Maternal Grandfather   . Lung cancer Paternal Aunt        smoker    Allergies: Allergies  Allergen Reactions  . Latex Rash  . Estrogens Other (See Comments)    Hx of PE on estrogen   Medications: See med rec.  Review of Systems: No fevers, chills, night sweats, weight loss, chest pain, or shortness of breath.   Objective:    General: Well Developed, well nourished, and in no acute distress.  Neuro: Alert  and oriented x3, extra-ocular muscles intact, sensation grossly intact.  HEENT: Normocephalic, atraumatic, pupils equal round reactive to light, neck supple, no masses, no lymphadenopathy, thyroid nonpalpable.  Skin: Warm and dry, no rashes. Cardiac: Regular rate and rhythm, no murmurs rubs or gallops, no lower extremity edema.  Respiratory: Clear to auscultation bilaterally. Not using accessory muscles, speaking in full sentences. Right hand: There is a small contusion over the dorsum of the hand, at the base of the second and third metacarpals.  Minimal tenderness here, good strength with flexion and extension of the wrist, as well as flexion and extension of all of the fingers.  Neurovascularly intact distally.  Impression and Recommendations:    Contusion of hand, right Dorsal contusion approximately 2 days ago. Good motion, good  strength, neurovascularly intact. The hand was strapped with a compressive dressing, getting x-rays, she will use arthritis strength Tylenol, no NSAIDs as she is on Xarelto. Return to see me as needed. ___________________________________________ Ihor Austin. Benjamin Stain, M.D., ABFM., CAQSM. Primary Care and Sports Medicine  [facility]  Adjunct Professor of Family Medicine  [employer]

## 2019-01-10 NOTE — Patient Instructions (Signed)
Contusion    A contusion is a deep bruise. Contusions are the result of a blunt injury to tissues and muscle fibers under the skin. The injury causes bleeding under the skin. The skin overlying the contusion may turn blue, purple, or yellow. Minor injuries will give you a painless contusion, but more severe contusions may stay painful and swollen for a few weeks.  What are the causes?  This condition is usually caused by a blow, trauma, or direct force to an area of the body.  What are the signs or symptoms?  Symptoms of this condition include:   Swelling of the injured area.   Pain and tenderness in the injured area.   Discoloration. The area may have redness and then turn blue, purple, or yellow.  How is this diagnosed?  This condition is diagnosed based on a physical exam and medical history. An X-ray, CT scan, or MRI may be needed to determine if there are any associated injuries, such as broken bones (fractures).  How is this treated?  Specific treatment for this condition depends on what area of the body was injured. In general, the best treatment for a contusion is resting, icing, applying pressure to (compression), and elevating the injured area. This is often called the RICE strategy. Over-the-counter anti-inflammatory medicines may also be recommended for pain control.  Follow these instructions at home:   Rest the injured area.   If directed, apply ice to the injured area:  ? Put ice in a plastic bag.  ? Place a towel between your skin and the bag.  ? Leave the ice on for 20 minutes, 2-3 times per day.   If directed, apply light compression to the injured area using an elastic bandage. Make sure the bandage is not wrapped too tightly. Remove and reapply the bandage as directed by your health care provider.   If possible, raise (elevate) the injured area above the level of your heart while you are sitting or lying down.   Take over-the-counter and prescription medicines only as told by your health  care provider.  Contact a health care provider if:   Your symptoms do not improve after several days of treatment.   Your symptoms get worse.   You have difficulty moving the injured area.  Get help right away if:   You have severe pain.   You have numbness in a hand or foot.   Your hand or foot turns pale or cold.  This information is not intended to replace advice given to you by your health care provider. Make sure you discuss any questions you have with your health care provider.  Document Released: 08/09/2005 Document Revised: 03/09/2016 Document Reviewed: 03/17/2015  Elsevier Interactive Patient Education  2019 [employer]

## 2019-01-13 NOTE — Progress Notes (Signed)
Pt has seen results on MyChart and message also sent for patient to call back if any questions.

## 2019-02-04 ENCOUNTER — Encounter: Payer: Self-pay | Admitting: Hematology & Oncology

## 2019-02-05 ENCOUNTER — Other Ambulatory Visit: Payer: BLUE CROSS/BLUE SHIELD

## 2019-02-05 ENCOUNTER — Ambulatory Visit: Payer: BLUE CROSS/BLUE SHIELD | Admitting: Hematology & Oncology

## 2019-02-07 ENCOUNTER — Inpatient Hospital Stay: Payer: BLUE CROSS/BLUE SHIELD | Attending: Hematology & Oncology

## 2019-02-07 ENCOUNTER — Other Ambulatory Visit: Payer: Self-pay

## 2019-02-07 ENCOUNTER — Inpatient Hospital Stay (HOSPITAL_BASED_OUTPATIENT_CLINIC_OR_DEPARTMENT_OTHER): Payer: BLUE CROSS/BLUE SHIELD | Admitting: Hematology & Oncology

## 2019-02-07 VITALS — BP 118/93 | HR 71 | Temp 98.5°F | Resp 17 | Wt 187.5 lb

## 2019-02-07 DIAGNOSIS — R079 Chest pain, unspecified: Secondary | ICD-10-CM | POA: Insufficient documentation

## 2019-02-07 DIAGNOSIS — I2699 Other pulmonary embolism without acute cor pulmonale: Secondary | ICD-10-CM

## 2019-02-07 LAB — CBC WITH DIFFERENTIAL (CANCER CENTER ONLY)
Abs Immature Granulocytes: 0.02 K/uL (ref 0.00–0.07)
Basophils Absolute: 0 K/uL (ref 0.0–0.1)
Basophils Relative: 0 %
Eosinophils Absolute: 0.2 K/uL (ref 0.0–0.5)
Eosinophils Relative: 3 %
HCT: 45.1 % (ref 36.0–46.0)
Hemoglobin: 14.8 g/dL (ref 12.0–15.0)
Immature Granulocytes: 0 %
Lymphocytes Relative: 38 %
Lymphs Abs: 3.2 K/uL (ref 0.7–4.0)
MCH: 29.5 pg (ref 26.0–34.0)
MCHC: 32.8 g/dL (ref 30.0–36.0)
MCV: 90 fL (ref 80.0–100.0)
Monocytes Absolute: 0.5 K/uL (ref 0.1–1.0)
Monocytes Relative: 6 %
Neutro Abs: 4.4 K/uL (ref 1.7–7.7)
Neutrophils Relative %: 53 %
Platelet Count: 284 K/uL (ref 150–400)
RBC: 5.01 MIL/uL (ref 3.87–5.11)
RDW: 12.7 % (ref 11.5–15.5)
WBC Count: 8.3 K/uL (ref 4.0–10.5)
nRBC: 0 % (ref 0.0–0.2)

## 2019-02-07 LAB — D-DIMER, QUANTITATIVE: D-Dimer, Quant: 0.28 ug/mL-FEU (ref 0.00–0.50)

## 2019-02-07 LAB — CMP (CANCER CENTER ONLY)
ALT: 38 U/L (ref 0–44)
AST: 24 U/L (ref 15–41)
Albumin: 4.6 g/dL (ref 3.5–5.0)
Alkaline Phosphatase: 78 U/L (ref 38–126)
Anion gap: 10 (ref 5–15)
BUN: 9 mg/dL (ref 6–20)
CO2: 26 mmol/L (ref 22–32)
Calcium: 9.7 mg/dL (ref 8.9–10.3)
Chloride: 103 mmol/L (ref 98–111)
Creatinine: 0.72 mg/dL (ref 0.44–1.00)
GFR, Est AFR Am: >60 mL/min (ref >60)
GFR, Estimated: >60 mL/min (ref >60)
Glucose, Bld: 82 mg/dL (ref 70–99)
POTASSIUM: 3.9 mmol/L (ref 3.5–5.1)
Sodium: 139 mmol/L (ref 135–145)
Total Bilirubin: 0.6 mg/dL (ref 0.3–1.2)
Total Protein: 8.2 g/dL — ABNORMAL HIGH (ref 6.5–8.1)

## 2019-02-07 LAB — LACTATE DEHYDROGENASE: LDH: 270 U/L — ABNORMAL HIGH (ref 98–192)

## 2019-02-07 NOTE — Progress Notes (Signed)
Hematology and Oncology Follow Up Visit  MAHOGANIE INNESS [id number] [birth year] 28 y.o. 02/07/2019   Principle Diagnosis:   Pulmonary embolism of the lingula artery  Positive lupus anticoagulant - possibly transient  Protein S deficiency (?)  Current Therapy:    Xarelto 20 mg by mouth daily - complete 1 year in June 2019  Xarelto 10 mg po q day -patient stopped on January 2020     Interim History:  Ms. Bucek is back for follow-up.  Apparently, she stopped the Xarelto couple months ago.  I am not sure why she stopped it.  She said this was inadvertent.  However, she has not restarted it as of yet.  She was having some occasional chest wall discomfort.  She is having no cough.  There is no leg swelling.  She has had no nausea or vomiting.  There is been no fever.  Her d-dimer was normal today.  As such, I think that we can probably have her stay off it for right now.  Overall, her performance status is ECOG 0.  Medications:  Current Outpatient Medications:  .  albuterol (PROVENTIL HFA;VENTOLIN HFA) 108 (90 BASE) MCG/ACT inhaler, Inhale into the lungs every 6 (six) hours as needed for wheezing or shortness of breath., Disp: , Rfl:  .  Probiotic Product (PROBIOTIC DAILY PO), Take by mouth., Disp: , Rfl:  .  rivaroxaban (XARELTO) 10 MG TABS tablet, Take 1 tablet (10 mg total) by mouth daily with supper. (Patient not taking: Reported on 02/07/2019), Disp: 30 tablet, Rfl: 11  Allergies:  Allergies  Allergen Reactions  . Latex Rash  . Estrogens Other (See Comments)    Hx of PE on estrogen    Past Medical History, Surgical history, Social history, and Family History were reviewed and updated.  Review of Systems: Review of Systems  Constitutional: Negative for appetite change, fatigue, fever and unexpected weight change.  HENT:   Negative for lump/mass, mouth sores, sore throat and trouble swallowing.   Respiratory: Negative for cough, hemoptysis and shortness of breath.    Cardiovascular: Negative for leg swelling and palpitations.  Gastrointestinal: Negative for abdominal distention, abdominal pain, blood in stool, constipation, diarrhea, nausea and vomiting.  Genitourinary: Negative for bladder incontinence, dysuria, frequency and hematuria.   Musculoskeletal: Negative for arthralgias, back pain, gait problem and myalgias.  Skin: Negative for itching and rash.  Neurological: Negative for dizziness, extremity weakness, gait problem, headaches, numbness, seizures and speech difficulty.  Hematological: Does not bruise/bleed easily.  Psychiatric/Behavioral: Negative for depression and sleep disturbance. The patient is not nervous/anxious.     Physical Exam:  weight is 187 lb 8 oz (85 kg). Her oral temperature is 98.5 F (36.9 C). Her blood pressure is 118/93 (abnormal) and her pulse is 71. Her respiration is 17 and oxygen saturation is 100%.   Wt Readings from Last 3 Encounters:  02/07/19 187 lb 8 oz (85 kg)  01/10/19 189 lb (85.7 kg)  07/10/18 178 lb 9.6 oz (81 kg)    Physical Exam Vitals signs reviewed.  HENT:     Head: Normocephalic and atraumatic.  Eyes:     Pupils: Pupils are equal, round, and reactive to light.  Neck:     Musculoskeletal: Normal range of motion.  Cardiovascular:     Rate and Rhythm: Normal rate and regular rhythm.     Heart sounds: Normal heart sounds.  Pulmonary:     Effort: Pulmonary effort is normal.     Breath sounds: Normal breath sounds.  Abdominal:     General: Bowel sounds are normal.     Palpations: Abdomen is soft.  Musculoskeletal: Normal range of motion.        General: No tenderness or deformity.  Lymphadenopathy:     Cervical: No cervical adenopathy.  Skin:    General: Skin is warm and dry.     Findings: No erythema or rash.  Neurological:     Mental Status: She is alert and oriented to person, place, and time.  Psychiatric:        Behavior: Behavior normal.        Thought Content: Thought content  normal.        Judgment: Judgment normal.      Lab Results  Component Value Date   WBC 8.3 02/07/2019   HGB 14.8 02/07/2019   HCT 45.1 02/07/2019   MCV 90.0 02/07/2019   PLT 284 02/07/2019     Chemistry      Component Value Date/Time   NA 137 07/10/2018 1041   NA 137 05/30/2017 1441   K 3.9 07/10/2018 1041   K 3.3 05/30/2017 1441   CL 103 07/10/2018 1041   CL 101 05/30/2017 1441   CO2 24 07/10/2018 1041   CO2 29 05/30/2017 1441   BUN 9 07/10/2018 1041   BUN 8 05/30/2017 1441   CREATININE 0.73 07/10/2018 1041      Component Value Date/Time   CALCIUM 9.3 07/10/2018 1041   CALCIUM 9.0 05/30/2017 1441   ALKPHOS 57 04/15/2018 0921   ALKPHOS 64 05/30/2017 1441   AST 13 07/10/2018 1041   AST 20 04/15/2018 0921   ALT 18 07/10/2018 1041   ALT 20 04/15/2018 0921   ALT 39 05/30/2017 1441   BILITOT 0.4 07/10/2018 1041   BILITOT 0.6 04/15/2018 0921         Impression and Plan: Ms. Mcfield is a 28 year old white female. She had a pulmonary embolism. This was diagnosed back in June.  For right now, we will have her stay off the Xarelto.  Given that the d-dimer is normal, this is very encouraging.  I will have her come back to see [place] in about 3 to 4 months just to make sure everything is doing okay.  If everything is doing okay at that time, then we will let her go from the clinic for good.     Josph Macho, MD 3/27/202010:10 AM

## 2019-02-13 ENCOUNTER — Ambulatory Visit: Payer: BLUE CROSS/BLUE SHIELD | Admitting: Hematology & Oncology

## 2019-02-13 ENCOUNTER — Other Ambulatory Visit: Payer: BLUE CROSS/BLUE SHIELD

## 2019-03-05 DIAGNOSIS — D2372 Other benign neoplasm of skin of left lower limb, including hip: Secondary | ICD-10-CM | POA: Diagnosis not present

## 2019-03-17 DIAGNOSIS — H5213 Myopia, bilateral: Secondary | ICD-10-CM | POA: Diagnosis not present

## 2019-03-17 DIAGNOSIS — H52223 Regular astigmatism, bilateral: Secondary | ICD-10-CM | POA: Diagnosis not present

## 2019-04-22 ENCOUNTER — Other Ambulatory Visit: Payer: Self-pay

## 2019-04-22 ENCOUNTER — Encounter (HOSPITAL_BASED_OUTPATIENT_CLINIC_OR_DEPARTMENT_OTHER): Payer: Self-pay | Admitting: Emergency Medicine

## 2019-04-22 ENCOUNTER — Emergency Department (HOSPITAL_BASED_OUTPATIENT_CLINIC_OR_DEPARTMENT_OTHER)
Admission: EM | Admit: 2019-04-22 | Discharge: 2019-04-22 | Disposition: A | Discharge status: Home / Self Care | Payer: BC Managed Care – PPO | Attending: Emergency Medicine | Admitting: Emergency Medicine

## 2019-04-22 DIAGNOSIS — S0990XA Unspecified injury of head, initial encounter: Secondary | ICD-10-CM | POA: Diagnosis present

## 2019-04-22 DIAGNOSIS — R509 Fever, unspecified: Secondary | ICD-10-CM | POA: Insufficient documentation

## 2019-04-22 DIAGNOSIS — Z87891 Personal history of nicotine dependence: Secondary | ICD-10-CM | POA: Diagnosis not present

## 2019-04-22 DIAGNOSIS — Y92000 Kitchen of unspecified non-institutional (private) residence as  the place of occurrence of the external cause: Secondary | ICD-10-CM | POA: Diagnosis not present

## 2019-04-22 DIAGNOSIS — Y999 Unspecified external cause status: Secondary | ICD-10-CM | POA: Insufficient documentation

## 2019-04-22 DIAGNOSIS — Z79899 Other long term (current) drug therapy: Secondary | ICD-10-CM | POA: Insufficient documentation

## 2019-04-22 DIAGNOSIS — Y9301 Activity, walking, marching and hiking: Secondary | ICD-10-CM | POA: Diagnosis not present

## 2019-04-22 DIAGNOSIS — S060X0A Concussion without loss of consciousness, initial encounter: Secondary | ICD-10-CM

## 2019-04-22 DIAGNOSIS — W01190A Fall on same level from slipping, tripping and stumbling with subsequent striking against furniture, initial encounter: Secondary | ICD-10-CM | POA: Diagnosis not present

## 2019-04-22 DIAGNOSIS — Z91040 Latex allergy status: Secondary | ICD-10-CM | POA: Diagnosis not present

## 2019-04-22 DIAGNOSIS — Z7901 Long term (current) use of anticoagulants: Secondary | ICD-10-CM | POA: Insufficient documentation

## 2019-04-22 NOTE — ED Triage Notes (Signed)
Pt states she hit her head on a cabinet on Sunday afternoon  Pt states it bled and she had a knot where she hit it  Since then having headaches, nausea, movement makes her dizzy, and feels weak

## 2019-04-22 NOTE — Discharge Instructions (Signed)
Try to avoid people who you could get sick if you have the coronavirus.  Follow up with your doctor.

## 2019-04-22 NOTE — ED Provider Notes (Signed)
[facility] EMERGENCY DEPARTMENT Provider Note   CSN: [id number] Arrival date & time: 04/22/19  2134    History   Chief Complaint Chief Complaint  Patient presents with  . Head Injury    HPI [name] is a 28 y.o. female.     28 yo F with a chief complaints of headache.  This is after the patient hit her head on a cabinet.  She was walking and lost her balance and hit the crown of her head on a cabinet in the kitchen.  She denies loss of consciousness had some headache and nausea and had one episode of vomiting that evening.  This was 4 days ago.  Since then has had episodic headaches and some dizziness and nausea.  Denies unilateral numbness or weakness denies difficulty with speech or swallowing.  She works as a Scientist, research (life sciences) and has been taking her temperature daily prior to going to work and has not been febrile.  Denies cough or congestion denies abdominal pain vomiting or diarrhea.  Denies urinary symptoms.  Denies chest pain denies shortness of breath.  Denies sick contacts.  The patient has had a pulmonary embolism before and just got off anticoagulation with Xarelto.  The history is provided by the patient.  Head Injury  Head/neck injury location: Crown. Time since incident:  4 days Mechanism of injury: direct blow   Pain details:    Quality:  Aching   Severity:  Mild   Duration:  4 days   Timing:  Constant   Progression:  Worsening Chronicity:  New Relieved by:  Nothing Worsened by:  Nothing Ineffective treatments:  None tried Associated symptoms: headache   Associated symptoms: no nausea and no vomiting     Past Medical History:  Diagnosis Date  . Allergy   . Anxiety   . Depression   . PE (pulmonary thromboembolism) (HCC) 05/02/2017    Patient Active Problem List   Diagnosis Date Noted  . Contusion of hand, right 01/10/2019  . Coccygeal pain 06/11/2018  . PE (pulmonary thromboembolism) (HCC) 05/02/2017  . GAD (generalized anxiety  disorder) 10/07/2014  . ALLERGIC RHINITIS CAUSE UNSPECIFIED 04/07/2008    Past Surgical History:  Procedure Laterality Date  . CHOLECYSTECTOMY  08-2009     OB History    Gravida  1   Para  1   Term  1   Preterm      AB      Living  1     SAB      TAB      Ectopic      Multiple      Live Births               Home Medications    Prior to Admission medications   Medication Sig Start Date End Date Taking? Authorizing Provider  albuterol (PROVENTIL HFA;VENTOLIN HFA) 108 (90 BASE) MCG/ACT inhaler Inhale into the lungs every 6 (six) hours as needed for wheezing or shortness of breath.    [provider]  Probiotic Product (PROBIOTIC DAILY PO) Take by mouth.    [provider]  rivaroxaban (XARELTO) 10 MG TABS tablet Take 1 tablet (10 mg total) by mouth daily with supper. Patient not taking: Reported on 02/07/2019 04/15/18   Michaela Macho, MD    Family History Family History  Problem Relation Age of Onset  . Depression Mother   . Diabetes Father   . Hypertension Father   . Hyperlipidemia Father   .  Depression Brother   . Colon cancer Maternal Grandfather   . Prostate cancer Maternal Grandfather   . Melanoma Maternal Grandfather   . Lung cancer Paternal Aunt        smoker     Social History Social History   Tobacco Use  . Smoking status: Former Smoker    Last attempt to quit: 11/14/2007    Years since quitting: 11.4  . Smokeless tobacco: Never Used  Substance Use Topics  . Alcohol use: No    Comment: once every couple of months.   . Drug use: No     Allergies   Latex and Estrogens   Review of Systems Review of Systems  Constitutional: Negative for chills and fever.  HENT: Negative for congestion and rhinorrhea.   Eyes: Negative for redness and visual disturbance.  Respiratory: Negative for shortness of breath and wheezing.   Cardiovascular: Negative for chest pain and palpitations.  Gastrointestinal: Negative for nausea  and vomiting.  Genitourinary: Negative for dysuria and urgency.  Musculoskeletal: Negative for arthralgias and myalgias.  Skin: Negative for pallor and wound.  Neurological: Positive for headaches. Negative for dizziness.     Physical Exam Updated Vital Signs BP 125/71 (BP Location: Right Arm)   Pulse (!) 119   Temp 100.2 F (37.9 C) (Oral)   Resp 18   Ht 5\' 5"  (1.651 m)   Wt 77.1 kg   SpO2 99%   BMI 28.29 kg/m   Physical Exam Vitals signs and nursing note reviewed.  Constitutional:      General: She is not in acute distress.    Appearance: She is well-developed. She is not diaphoretic.  HENT:     Head: Normocephalic and atraumatic.  Eyes:     Pupils: Pupils are equal, round, and reactive to light.  Neck:     Musculoskeletal: Normal range of motion and neck supple.  Cardiovascular:     Rate and Rhythm: Normal rate and regular rhythm.     Heart sounds: No murmur. No friction rub. No gallop.   Pulmonary:     Effort: Pulmonary effort is normal.     Breath sounds: No wheezing or rales.  Abdominal:     General: There is no distension.     Palpations: Abdomen is soft.     Tenderness: There is no abdominal tenderness.  Musculoskeletal:        General: No tenderness.  Skin:    General: Skin is warm and dry.  Neurological:     Mental Status: She is alert and oriented to person, place, and time.     GCS: GCS eye subscore is 4. GCS verbal subscore is 5. GCS motor subscore is 6.     Cranial Nerves: Cranial nerves are intact.     Sensory: Sensation is intact.     Motor: Motor function is intact.     Coordination: Coordination is intact.     Gait: Gait is intact.  Psychiatric:        Behavior: Behavior normal.      ED Treatments / Results  Labs (all labs ordered are listed, but only abnormal results are displayed) Labs Reviewed - No data to display  EKG None  Radiology No results found.  Procedures Procedures (including critical care time)  Medications  Ordered in ED Medications - No data to display   Initial Impression / Assessment and Plan / ED Course  I have reviewed the triage vital signs and the nursing notes.  Pertinent labs &  imaging results that were available during my care of the patient were reviewed by me and considered in my medical decision making (see chart for details).        28 yo F with a cc of of a closed head injury.  This happened about 4 days ago.  She has a benign neurologic exam.  Had one episode of vomiting initially but has resolved.  Most likely this is postconcussive syndrome.  I discussed the risks and benefits of CT imaging with the patient and at this point she will decline.  She was spontaneously noted to have a fever here today.  No other symptoms.  I think meningitis is unlikely without neck stiffness or rigidity.  At this point since we will have her stay out of work for at least 72 hours after her fever.  I did offer to test her for the coronavirus though with less than 6 hours of fever I thought the utility of testing might be low.  She will follow-up with her family doctor.  PAYAL STANFORTH was evaluated in Emergency Department on 04/22/2019 for the symptoms described in the history of present illness. He/she was evaluated in the context of the global COVID-19 pandemic, which necessitated consideration that the patient might be at risk for infection with the SARS-CoV-2 virus that causes COVID-19. Institutional protocols and algorithms that pertain to the evaluation of patients at risk for COVID-19 are in a state of rapid change based on information released by regulatory bodies including the CDC and federal and state organizations. These policies and algorithms were followed during the patient's care in the ED.  10:38 PM:  I have discussed the diagnosis/risks/treatment options with the patient and believe the pt to be eligible for discharge home to follow-up with PCP. We also discussed returning to the ED  immediately if new or worsening sx occur. We discussed the sx which are most concerning (e.g., sudden worsening pain, fever, inability to tolerate by mouth) that necessitate immediate return. Medications administered to the patient during their visit and any new prescriptions provided to the patient are listed below.  Medications given during this visit Medications - No data to display   The patient appears reasonably screen and/or stabilized for discharge and I doubt any other medical condition or other [facility] requiring further screening, evaluation, or treatment in the ED at this time prior to discharge.    Final Clinical Impressions(s) / ED Diagnoses   Final diagnoses:  Concussion without loss of consciousness, initial encounter  Fever in adult    ED Discharge Orders    None       Deno Etienne, DO 04/22/19 2238

## 2019-04-24 ENCOUNTER — Ambulatory Visit (INDEPENDENT_AMBULATORY_CARE_PROVIDER_SITE_OTHER): Payer: BC Managed Care – PPO | Admitting: Family Medicine

## 2019-04-24 ENCOUNTER — Encounter: Payer: Self-pay | Admitting: Family Medicine

## 2019-04-24 VITALS — BP 120/62 | HR 98 | Temp 98.9°F | Ht 65.0 in | Wt 192.0 lb

## 2019-04-24 DIAGNOSIS — F0781 Postconcussional syndrome: Secondary | ICD-10-CM | POA: Diagnosis not present

## 2019-04-24 DIAGNOSIS — S060X0A Concussion without loss of consciousness, initial encounter: Secondary | ICD-10-CM

## 2019-04-24 DIAGNOSIS — J029 Acute pharyngitis, unspecified: Secondary | ICD-10-CM

## 2019-04-24 DIAGNOSIS — S0990XA Unspecified injury of head, initial encounter: Secondary | ICD-10-CM | POA: Diagnosis not present

## 2019-04-24 DIAGNOSIS — R509 Fever, unspecified: Secondary | ICD-10-CM

## 2019-04-24 LAB — POCT RAPID STREP A (OFFICE): Rapid Strep A Screen: NEGATIVE

## 2019-04-24 MED ORDER — AMOXICILLIN 875 MG PO TABS: 875.0000 mg | ORAL_TABLET | Freq: Two times a day (BID) | ORAL | 0 refills | Status: DC

## 2019-04-24 NOTE — Addendum Note (Signed)
Addended by: Teddy Spike on: 04/24/2019 04:00 PM   Modules accepted: Orders

## 2019-04-24 NOTE — Progress Notes (Signed)
Acute Office Visit  Subjective:    Patient ID: [name], female    DOB: [birth year], 28 y.o.   MRN: [id number]  Chief Complaint  Patient presents with  . Follow-up    HPI Patient is in today for head injury.  She evidently hit her head on a cabinet on 6/9.  She said she was walking and actually lost her balance and hit her head on the cabinet in the kitchen.  She did not lose consciousness but has had some headache and nausea and so actually went to the emergency department 2 days ago.  She did actually have one episode of vomiting the evening that she hit her head.  Also been experiencing some intermittent dizziness since the injury.  Since then she still continued to have a headache at the very top of her head.  Right now she rates it a 3 out of 10 but she is really been trying to rest.  She is been avoiding doing a lot but most days it is more between a 5-7 out of 10.  She says it also for the last 2 days she has been running a low-grade temperature the highest around 100.9.  Though she has not run a fever so far today.  But she feels like her throat is sore and her lymph nodes are swollen.  She feels very achy and fatigued when she first wakes up.  She had some diarrhea initially after she hit her head but that has actually gone away.  She says it really almost feels like she has strep throat.  History of pulmonary embolism as well as but off of her Xarelto.  Past Medical History:  Diagnosis Date  . Allergy   . Anxiety   . Depression   . PE (pulmonary thromboembolism) (HCC) 05/02/2017    Past Surgical History:  Procedure Laterality Date  . CHOLECYSTECTOMY  08-2009    Family History  Problem Relation Age of Onset  . Depression Mother   . Diabetes Father   . Hypertension Father   . Hyperlipidemia Father   . Depression Brother   . Colon cancer Maternal Grandfather   . Prostate cancer Maternal Grandfather   . Melanoma Maternal Grandfather   . Lung cancer Paternal Aunt         smoker     Social History   Socioeconomic History  . Marital status: Single    Spouse name: Not on file  . Number of children: 1  . Years of education: Not on file  . Highest education level: Not on file  Occupational History  . Occupation: Field seismologistpolice dispatcher    Employer: [employer]    [place]: [place]   Social Needs  . Financial resource strain: Not on file  . Food insecurity    Worry: Not on file    Inability: Not on file  . Transportation needs    Medical: Not on file    Non-medical: Not on file  Tobacco Use  . Smoking status: Former Smoker    Quit date: 11/14/2007    Years since quitting: 11.4  . Smokeless tobacco: Never Used  Substance and Sexual Activity  . Alcohol use: No    Comment: once every couple of months.   . Drug use: No  . Sexual activity: Yes    Partners: Male    Birth control/protection: Patch    Comment:  HS diploma, 5 caffeine drinks daily,no regular exercise.  Lifestyle  . Physical  activity    Days per week: Not on file    Minutes per session: Not on file  . Stress: Not on file  Relationships  . Social Musician on phone: Not on file    Gets together: Not on file    Attends religious service: Not on file    Active member of club or organization: Not on file    Attends meetings of clubs or organizations: Not on file    Relationship status: Not on file  . Intimate partner violence    Fear of current or ex partner: Not on file    Emotionally abused: Not on file    Physically abused: Not on file    Forced sexual activity: Not on file  Other Topics Concern  . Not on file  Social History Narrative   Exercise 3-4 days per week. 1-2 cups  Caffeine per day. .      Outpatient Medications Prior to Visit  Medication Sig Dispense Refill  . albuterol (PROVENTIL HFA;VENTOLIN HFA) 108 (90 BASE) MCG/ACT inhaler Inhale into the lungs every 6 (six) hours as needed for wheezing or shortness of breath.    . cetirizine (ZYRTEC) 10  MG tablet Take 10 mg by mouth daily.    . Probiotic Product (PROBIOTIC DAILY PO) Take by mouth.    . rivaroxaban (XARELTO) 10 MG TABS tablet Take 1 tablet (10 mg total) by mouth daily with supper. (Patient not taking: Reported on 02/07/2019) 30 tablet 11   No facility-administered medications prior to visit.     Allergies  Allergen Reactions  . Latex Rash  . Estrogens Other (See Comments)    Hx of PE on estrogen    ROS     Objective:    Physical Exam  Constitutional: She is oriented to person, place, and time. She appears well-developed and well-nourished.  HENT:  Head: Normocephalic and atraumatic.  Right Ear: External ear normal.  Left Ear: External ear normal.  Nose: Nose normal.  TMs and canals are clear.  Tonsils are erythematous with some white discharge.  Eyes: Pupils are equal, round, and reactive to light. Conjunctivae and EOM are normal.  Neck: Neck supple. No thyromegaly present.  Cardiovascular: Normal rate, regular rhythm and normal heart sounds.  Pulmonary/Chest: Effort normal and breath sounds normal. She has no wheezes.  Lymphadenopathy:    She has no cervical adenopathy.  Neurological: She is alert and oriented to person, place, and time. She has normal reflexes. No cranial nerve deficit. She exhibits normal muscle tone. Coordination normal.  Skin: Skin is warm and dry.  Psychiatric: She has a normal mood and affect.    BP 120/62   Pulse 98   Temp 98.9 F (37.2 C)   Ht 5\' 5"  (1.651 m)   Wt 192 lb (87.1 kg)   SpO2 100%   BMI 31.95 kg/m  Wt Readings from Last 3 Encounters:  04/24/19 192 lb (87.1 kg)  04/22/19 170 lb (77.1 kg)  02/07/19 187 lb 8 oz (85 kg)    There are no preventive care reminders to display for this patient.  There are no preventive care reminders to display for this patient.   Lab Results  Component Value Date   TSH 0.67 03/13/2017   Lab Results  Component Value Date   WBC 8.3 02/07/2019   HGB 14.8 02/07/2019   HCT 45.1  02/07/2019   MCV 90.0 02/07/2019   PLT 284 02/07/2019   Lab Results  Component  Value Date   NA 139 02/07/2019   K 3.9 02/07/2019   CO2 26 02/07/2019   GLUCOSE 82 02/07/2019   BUN 9 02/07/2019   CREATININE 0.72 02/07/2019   BILITOT 0.6 02/07/2019   ALKPHOS 78 02/07/2019   AST 24 02/07/2019   ALT 38 02/07/2019   PROT 8.2 (H) 02/07/2019   ALBUMIN 4.6 02/07/2019   CALCIUM 9.7 02/07/2019   ANIONGAP 10 02/07/2019   Lab Results  Component Value Date   CHOL 148 07/10/2018   Lab Results  Component Value Date   HDL 48 (L) 07/10/2018   Lab Results  Component Value Date   LDLCALC 82 07/10/2018   Lab Results  Component Value Date   TRIG 89 07/10/2018   Lab Results  Component Value Date   CHOLHDL 3.1 07/10/2018   Lab Results  Component Value Date   HGBA1C 5.1 07/10/2018       Assessment & Plan:   Problem List Items Addressed This Visit    None    Visit Diagnoses    Injury of head, initial encounter    -  Primary   Concussion without loss of consciousness, initial encounter       Post concussive syndrome       Pharyngitis, unspecified etiology       Fever, unspecified fever cause         Head injury/postconcussive syndrome-discussed that sometimes it can take days to weeks to resolve.  Though also think she has some type of viral pharyngitis or bacterial pharyngitis on top of this which is likely exacerbating her symptoms.  We discussed that we could always consider a trial of amitriptyline if not improving at bedtime.  And continue to minimize screen time and avoid strenuous activities.  Pharyngitis with fever-suspect strep throat.  Rapid test was negative we are going to send a culture but in the meantime I am going ahead and putting her on amoxicillin for now.  We can discontinue it if the culture is negative.  No orders of the defined types were placed in this encounter.    Beatrice Lecher, MD

## 2019-04-25 ENCOUNTER — Ambulatory Visit: Payer: BC Managed Care – PPO | Admitting: Osteopathic Medicine

## 2019-04-25 ENCOUNTER — Encounter: Payer: Self-pay | Admitting: *Deleted

## 2019-04-25 ENCOUNTER — Telehealth: Payer: Self-pay | Admitting: Family Medicine

## 2019-04-25 NOTE — Telephone Encounter (Signed)
Patient calling in stating that she needs a letter excusing her from work this weekend due to everything going on from the visit yesterday. Would like to come and pick it up today if possible. Please advise.

## 2019-04-25 NOTE — Telephone Encounter (Signed)
OK for work note through [employer]. Please provider.

## 2019-04-25 NOTE — Telephone Encounter (Signed)
Patient has been made aware. Printed and completed, placed up front for pick up. No further questions at this time.

## 2019-04-26 LAB — CULTURE, GROUP A STREP
MICRO NUMBER:: 560294
SPECIMEN QUALITY:: ADEQUATE

## 2019-05-08 ENCOUNTER — Other Ambulatory Visit: Payer: Self-pay

## 2019-05-08 ENCOUNTER — Inpatient Hospital Stay: Payer: BC Managed Care – PPO | Attending: Hematology & Oncology | Admitting: Hematology & Oncology

## 2019-05-08 ENCOUNTER — Inpatient Hospital Stay: Payer: BC Managed Care – PPO

## 2019-05-08 VITALS — BP 119/67 | HR 76 | Temp 98.9°F | Resp 18 | Wt 193.0 lb

## 2019-05-08 DIAGNOSIS — I2699 Other pulmonary embolism without acute cor pulmonale: Secondary | ICD-10-CM | POA: Insufficient documentation

## 2019-05-08 DIAGNOSIS — Z79899 Other long term (current) drug therapy: Secondary | ICD-10-CM | POA: Diagnosis not present

## 2019-05-08 LAB — CBC WITH DIFFERENTIAL (CANCER CENTER ONLY)
Abs Immature Granulocytes: 0.02 10*3/uL (ref 0.00–0.07)
Basophils Absolute: 0 10*3/uL (ref 0.0–0.1)
Basophils Relative: 0 %
Eosinophils Absolute: 0.2 10*3/uL (ref 0.0–0.5)
Eosinophils Relative: 3 %
HCT: 43.7 % (ref 36.0–46.0)
Hemoglobin: 14.3 g/dL (ref 12.0–15.0)
Immature Granulocytes: 0 %
Lymphocytes Relative: 31 %
Lymphs Abs: 2.8 10*3/uL (ref 0.7–4.0)
MCH: 29.6 pg (ref 26.0–34.0)
MCHC: 32.7 g/dL (ref 30.0–36.0)
MCV: 90.5 fL (ref 80.0–100.0)
Monocytes Absolute: 0.4 10*3/uL (ref 0.1–1.0)
Monocytes Relative: 5 %
Neutro Abs: 5.4 10*3/uL (ref 1.7–7.7)
Neutrophils Relative %: 61 %
Platelet Count: 284 10*3/uL (ref 150–400)
RBC: 4.83 MIL/uL (ref 3.87–5.11)
RDW: 12.8 % (ref 11.5–15.5)
WBC Count: 8.9 10*3/uL (ref 4.0–10.5)
nRBC: 0 % (ref 0.0–0.2)

## 2019-05-08 LAB — CMP (CANCER CENTER ONLY)
ALT: 38 U/L (ref 0–44)
AST: 25 U/L (ref 15–41)
Albumin: 4 g/dL (ref 3.5–5.0)
Alkaline Phosphatase: 61 U/L (ref 38–126)
Anion gap: 8 (ref 5–15)
BUN: 11 mg/dL (ref 6–20)
CO2: 28 mmol/L (ref 22–32)
Calcium: 9.4 mg/dL (ref 8.9–10.3)
Chloride: 103 mmol/L (ref 98–111)
Creatinine: 0.69 mg/dL (ref 0.44–1.00)
GFR, Est AFR Am: >60 mL/min (ref >60)
GFR, Estimated: >60 mL/min (ref >60)
Glucose, Bld: 109 mg/dL — ABNORMAL HIGH (ref 70–99)
Potassium: 4 mmol/L (ref 3.5–5.1)
Sodium: 139 mmol/L (ref 135–145)
Total Bilirubin: 0.6 mg/dL (ref 0.3–1.2)
Total Protein: 7 g/dL (ref 6.5–8.1)

## 2019-05-08 LAB — D-DIMER, QUANTITATIVE: D-Dimer, Quant: 0.33 ug/mL-FEU (ref 0.00–0.50)

## 2019-05-08 NOTE — Progress Notes (Signed)
Hematology and Oncology Follow Up Visit  [name] [id number] 21-Sep-1991 28 y.o. 05/08/2019   Principle Diagnosis:   Pulmonary embolism of the lingula artery  Positive lupus anticoagulant - possibly transient    Current Therapy:    Xarelto 20 mg by mouth daily - complete 1 year in June 2019  Xarelto 10 mg po q day -patient stopped on January 2020     Interim History:  [name] is back for follow-up.  He is doing quite well.  She is quite busy at work.  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  She obviously is quite busy.  She is doing quite well without any problems with cough or shortness of breath.  There is no chest wall pain.  Of note, we did recheck her Protein S levels back in 2018 after she had her blood clot.  Her Protein S level was 92%.  He also rechecked her lupus anticoagulant screen back in 2019.  This did not show a lupus anticoagulant.  She has had no problems with fever.  She has had little bit of a rash.  It looks like a contact rash on her chest and shoulders.  She has had no change in bowel or bladder habits.  She said that she does not have a cycle right now.  Overall, her performance status is ECOG 0.  Medications:  Current Outpatient Medications:  .  albuterol (PROVENTIL HFA;VENTOLIN HFA) 108 (90 BASE) MCG/ACT inhaler, Inhale into the lungs every 6 (six) hours as needed for wheezing or shortness of breath., Disp: , Rfl:  .  cetirizine (ZYRTEC) 10 MG tablet, Take 10 mg by mouth daily., Disp: , Rfl:  .  Probiotic Product (PROBIOTIC DAILY PO), Take by mouth., Disp: , Rfl:   Allergies:  Allergies  Allergen Reactions  . Latex Rash  . Estrogens Other (See Comments)    Hx of PE on estrogen    Past Medical History, Surgical history, Social history, and Family History were reviewed and updated.  Review of Systems: Review of Systems  Constitutional: Negative for appetite change, fatigue, fever and unexpected weight change.  HENT:   Negative  for lump/mass, mouth sores, sore throat and trouble swallowing.   Respiratory: Negative for cough, hemoptysis and shortness of breath.   Cardiovascular: Negative for leg swelling and palpitations.  Gastrointestinal: Negative for abdominal distention, abdominal pain, blood in stool, constipation, diarrhea, nausea and vomiting.  Genitourinary: Negative for bladder incontinence, dysuria, frequency and hematuria.   Musculoskeletal: Negative for arthralgias, back pain, gait problem and myalgias.  Skin: Negative for itching and rash.  Neurological: Negative for dizziness, extremity weakness, gait problem, headaches, numbness, seizures and speech difficulty.  Hematological: Does not bruise/bleed easily.  Psychiatric/Behavioral: Negative for depression and sleep disturbance. The patient is not nervous/anxious.     Physical Exam:  weight is 193 lb (87.5 kg). Her oral temperature is 98.9 F (37.2 C). Her blood pressure is 119/67 and her pulse is 76. Her respiration is 18 and oxygen saturation is 100%.   Wt Readings from Last 3 Encounters:  05/08/19 193 lb (87.5 kg)  04/24/19 192 lb (87.1 kg)  04/22/19 170 lb (77.1 kg)    Physical Exam Vitals signs reviewed.  HENT:     Head: Normocephalic and atraumatic.  Eyes:     Pupils: Pupils are equal, round, and reactive to light.  Neck:     Musculoskeletal: Normal range of motion.  Cardiovascular:     Rate and Rhythm: Normal rate and  regular rhythm.     Heart sounds: Normal heart sounds.  Pulmonary:     Effort: Pulmonary effort is normal.     Breath sounds: Normal breath sounds.  Abdominal:     General: Bowel sounds are normal.     Palpations: Abdomen is soft.  Musculoskeletal: Normal range of motion.        General: No tenderness or deformity.  Lymphadenopathy:     Cervical: No cervical adenopathy.  Skin:    General: Skin is warm and dry.     Findings: No erythema or rash.  Neurological:     Mental Status: She is alert and oriented to  person, place, and time.  Psychiatric:        Behavior: Behavior normal.        Thought Content: Thought content normal.        Judgment: Judgment normal.      Lab Results  Component Value Date   WBC 8.9 05/08/2019   HGB 14.3 05/08/2019   HCT 43.7 05/08/2019   MCV 90.5 05/08/2019   PLT 284 05/08/2019     Chemistry      Component Value Date/Time   NA 139 05/08/2019 1043   NA 137 05/30/2017 1441   K 4.0 05/08/2019 1043   K 3.3 05/30/2017 1441   CL 103 05/08/2019 1043   CL 101 05/30/2017 1441   CO2 28 05/08/2019 1043   CO2 29 05/30/2017 1441   BUN 11 05/08/2019 1043   BUN 8 05/30/2017 1441   CREATININE 0.69 05/08/2019 1043   CREATININE 0.73 07/10/2018 1041      Component Value Date/Time   CALCIUM 9.4 05/08/2019 1043   CALCIUM 9.0 05/30/2017 1441   ALKPHOS 61 05/08/2019 1043   ALKPHOS 64 05/30/2017 1441   AST 25 05/08/2019 1043   ALT 38 05/08/2019 1043   ALT 39 05/30/2017 1441   BILITOT 0.6 05/08/2019 1043         Impression and Plan: [name] is a 28 year old white female. She had a pulmonary embolism. This was diagnosed back in June 2018.  She was on therapeutic Xarelto for a year.  This was completed in June 2019.  She then was on maintenance Xarelto which she stopped in January 2020.  I feel confident that everything looks quite good.  I think her risk of another thromboembolic event is probably less than 10%.  For right now we will let her go from the clinic.  Is not sure that we are adding to her medical care right now.  Hopefully, she will not have another thromboembolic event.  We will more than happy to see her back if she has any issues.  She knows that she cannot be on estrogens.  Overall, she is doing a great job in minimizing her risk factors.     Josph Macho, MD 6/25/202011:36 AM

## 2019-05-23 DIAGNOSIS — F4322 Adjustment disorder with anxiety: Secondary | ICD-10-CM | POA: Diagnosis not present

## 2019-06-12 DIAGNOSIS — N3001 Acute cystitis with hematuria: Secondary | ICD-10-CM | POA: Diagnosis not present

## 2019-06-12 DIAGNOSIS — R300 Dysuria: Secondary | ICD-10-CM | POA: Diagnosis not present

## 2019-06-21 DIAGNOSIS — N3000 Acute cystitis without hematuria: Secondary | ICD-10-CM | POA: Diagnosis not present

## 2019-06-21 DIAGNOSIS — R300 Dysuria: Secondary | ICD-10-CM | POA: Diagnosis not present

## 2019-06-24 ENCOUNTER — Encounter: Payer: Self-pay | Admitting: Family Medicine

## 2019-06-24 ENCOUNTER — Ambulatory Visit (INDEPENDENT_AMBULATORY_CARE_PROVIDER_SITE_OTHER): Payer: BC Managed Care – PPO | Admitting: Family Medicine

## 2019-06-24 VITALS — BP 117/71 | HR 72 | Ht 65.0 in | Wt 185.0 lb

## 2019-06-24 DIAGNOSIS — R300 Dysuria: Secondary | ICD-10-CM

## 2019-06-24 DIAGNOSIS — N390 Urinary tract infection, site not specified: Secondary | ICD-10-CM | POA: Diagnosis not present

## 2019-06-24 LAB — WET PREP FOR TRICH, YEAST, CLUE
MICRO NUMBER:: 758564
Specimen Quality: ADEQUATE

## 2019-06-24 LAB — POCT URINALYSIS DIPSTICK
Blood, UA: NEGATIVE
Glucose, UA: POSITIVE — AB
Ketones, UA: 15
Nitrite, UA: POSITIVE
Protein, UA: POSITIVE — AB
Spec Grav, UA: 1.02 (ref 1.010–1.025)
Urobilinogen, UA: 2 E.U./dL — AB
pH, UA: 5 (ref 5.0–8.0)

## 2019-06-24 MED ORDER — AMOXICILLIN-POT CLAVULANATE 875-125 MG PO TABS: 1.0000 | ORAL_TABLET | Freq: Two times a day (BID) | ORAL | 0 refills | Status: DC

## 2019-06-24 NOTE — Progress Notes (Signed)
Established Patient Office Visit  Subjective:  Patient ID: [name], female    DOB: March 01, 1991  Age: 28 y.o. MRN: [id number]  CC:  Chief Complaint  Patient presents with  . Urinary Tract Infection    HPI [name] presents for she was treated with macrobid on 7/30 and then Cipro x 3 days ealier this month. Having frequency, urgency. No N/V. Some diarrhea.  Taking AZO for 4 days.  No fever, sweats or chills. No blood in the urine. Culture from July was pos for E coli and was pan sensitive except to Bactrim.  Repeat Urine culture on 8/8 was negative.  She has a history of recurrent UTIs but has not had a problem in years.  She did see urology years ago for it.  Past Medical History:  Diagnosis Date  . Allergy   . Anxiety   . Depression   . PE (pulmonary thromboembolism) (HCC) 05/02/2017    Past Surgical History:  Procedure Laterality Date  . CHOLECYSTECTOMY  08-2009    Family History  Problem Relation Age of Onset  . Depression Mother   . Diabetes Father   . Hypertension Father   . Hyperlipidemia Father   . Depression Brother   . Colon cancer Maternal Grandfather   . Prostate cancer Maternal Grandfather   . Melanoma Maternal Grandfather   . Lung cancer Paternal Aunt        smoker     Social History   Socioeconomic History  . Marital status: Single    Spouse name: Not on file  . Number of children: 1  . Years of education: Not on file  . Highest education level: Not on file  Occupational History  . Occupation: Field seismologist: [employer]    Comment: [place] [employer] [place]   Social Needs  . Financial resource strain: Not on file  . Food insecurity    Worry: Not on file    Inability: Not on file  . Transportation needs    Medical: Not on file    Non-medical: Not on file  Tobacco Use  . Smoking status: Former Smoker    Quit date: 11/14/2007    Years since quitting: 11.6  . Smokeless tobacco: Never Used  Substance and Sexual Activity   . Alcohol use: No    Comment: once every couple of months.   . Drug use: No  . Sexual activity: Yes    Partners: Male    Birth control/protection: Patch    Comment:  HS diploma, 5 caffeine drinks daily,no regular exercise.  Lifestyle  . Physical activity    Days per week: Not on file    Minutes per session: Not on file  . Stress: Not on file  Relationships  . Social Musician on phone: Not on file    Gets together: Not on file    Attends religious service: Not on file    Active member of club or organization: Not on file    Attends meetings of clubs or organizations: Not on file    Relationship status: Not on file  . Intimate partner violence    Fear of current or ex partner: Not on file    Emotionally abused: Not on file    Physically abused: Not on file    Forced sexual activity: Not on file  Other Topics Concern  . Not on file  Social History Narrative   Exercise 3-4 days per  week. 1-2 cups  Caffeine per day. .      Outpatient Medications Prior to Visit  Medication Sig Dispense Refill  . albuterol (PROVENTIL HFA;VENTOLIN HFA) 108 (90 BASE) MCG/ACT inhaler Inhale into the lungs every 6 (six) hours as needed for wheezing or shortness of breath.    . cetirizine (ZYRTEC) 10 MG tablet Take 10 mg by mouth daily.    . Probiotic Product (PROBIOTIC DAILY PO) Take by mouth.     No facility-administered medications prior to visit.     Allergies  Allergen Reactions  . Latex Rash  . Estrogens Other (See Comments)    Hx of PE on estrogen    ROS Review of Systems    Objective:    Physical Exam  Constitutional: She is oriented to person, place, and time. She appears well-developed and well-nourished.  HENT:  Head: Normocephalic and atraumatic.  Cardiovascular: Normal rate, regular rhythm and normal heart sounds.  Pulmonary/Chest: Effort normal and breath sounds normal.  Musculoskeletal:     Comments: No CVA tenderness  Neurological: She is alert and  oriented to person, place, and time.  Skin: Skin is warm and dry.  Psychiatric: She has a normal mood and affect. Her behavior is normal.    BP 117/71   Pulse 72   Ht 5\' 5"  (1.651 m)   Wt 185 lb (83.9 kg)   SpO2 98%   BMI 30.79 kg/m  Wt Readings from Last 3 Encounters:  06/24/19 185 lb (83.9 kg)  05/08/19 193 lb (87.5 kg)  04/24/19 192 lb (87.1 kg)     Health Maintenance Due  Topic Date Due  . INFLUENZA VACCINE  06/14/2019    There are no preventive care reminders to display for this patient.  Lab Results  Component Value Date   TSH 0.67 03/13/2017   Lab Results  Component Value Date   WBC 8.9 05/08/2019   HGB 14.3 05/08/2019   HCT 43.7 05/08/2019   MCV 90.5 05/08/2019   PLT 284 05/08/2019   Lab Results  Component Value Date   NA 139 05/08/2019   K 4.0 05/08/2019   CO2 28 05/08/2019   GLUCOSE 109 (H) 05/08/2019   BUN 11 05/08/2019   CREATININE 0.69 05/08/2019   BILITOT 0.6 05/08/2019   ALKPHOS 61 05/08/2019   AST 25 05/08/2019   ALT 38 05/08/2019   PROT 7.0 05/08/2019   ALBUMIN 4.0 05/08/2019   CALCIUM 9.4 05/08/2019   ANIONGAP 8 05/08/2019   Lab Results  Component Value Date   CHOL 148 07/10/2018   Lab Results  Component Value Date   HDL 48 (L) 07/10/2018   Lab Results  Component Value Date   LDLCALC 82 07/10/2018   Lab Results  Component Value Date   TRIG 89 07/10/2018   Lab Results  Component Value Date   CHOLHDL 3.1 07/10/2018   Lab Results  Component Value Date   HGBA1C 5.1 07/10/2018      Assessment & Plan:   Problem List Items Addressed This Visit    None    Visit Diagnoses    Dysuria    -  Primary   Relevant Orders   POCT urinalysis dipstick (Completed)   Urine Culture   WET PREP FOR [place], YEAST, CLUE   Recurrent UTI       Relevant Orders   Urine Culture   WET PREP FOR [place], YEAST, CLUE     Recurrent UTI-medical head and place her on Augmentin for now.  Will  send for culture.  May end up needing to get urology  involved if we can get this completely knocked out to evaluate for other potential causes such as a bacterial barring kidney stone etc.  Meds ordered this encounter  Medications  . amoxicillin-clavulanate (AUGMENTIN) 875-125 MG tablet    Sig: Take 1 tablet by mouth 2 (two) times daily.    Dispense:  10 tablet    Refill:  0    Follow-up: Return if symptoms worsen or fail to improve.    Nani Gasser, MD

## 2019-06-25 LAB — EXTRA URINE SPECIMEN

## 2019-06-25 LAB — URINE CULTURE

## 2019-06-26 LAB — URINE CULTURE
MICRO NUMBER:: 760148
SPECIMEN QUALITY:: ADEQUATE

## 2019-07-02 ENCOUNTER — Ambulatory Visit (INDEPENDENT_AMBULATORY_CARE_PROVIDER_SITE_OTHER): Payer: BC Managed Care – PPO | Admitting: Family Medicine

## 2019-07-02 ENCOUNTER — Encounter: Payer: Self-pay | Admitting: Family Medicine

## 2019-07-02 ENCOUNTER — Other Ambulatory Visit: Payer: Self-pay

## 2019-07-02 VITALS — BP 109/59 | HR 82 | Ht 65.0 in | Wt 184.0 lb

## 2019-07-02 DIAGNOSIS — M7989 Other specified soft tissue disorders: Secondary | ICD-10-CM | POA: Diagnosis not present

## 2019-07-02 DIAGNOSIS — N390 Urinary tract infection, site not specified: Secondary | ICD-10-CM

## 2019-07-02 DIAGNOSIS — R7989 Other specified abnormal findings of blood chemistry: Secondary | ICD-10-CM | POA: Diagnosis not present

## 2019-07-02 LAB — POCT URINALYSIS DIPSTICK
Blood, UA: NEGATIVE
Glucose, UA: NEGATIVE
Ketones, UA: 15
Leukocytes, UA: NEGATIVE
Nitrite, UA: NEGATIVE
Protein, UA: NEGATIVE
Spec Grav, UA: 1.025 (ref 1.010–1.025)
Urobilinogen, UA: 1 E.U./dL
pH, UA: 6 (ref 5.0–8.0)

## 2019-07-02 NOTE — Progress Notes (Signed)
Established Patient Office Visit  Subjective:  Patient ID: [name], female    DOB: 11/25/1990  Age: 28 y.o. MRN: [id number]  CC:  Chief Complaint  Patient presents with  . Follow-up    HPI [name] presents for follow up of UTI.  She says she still just feels like she is a little bit sore near the urethra she has not noticed any redness or rash that she has checked.  She says it is much better than it was it just has not completely resolved.  She did complete her antibiotic.  She denies any fever, sweats, chills.  She denies any GI symptoms including nausea, vomiting, diarrhea.  No significant pelvic pain or flank pain.  She denies any vaginal irritation itching or discharge.  She just wants to make sure that the UTI has cleared.  Her last urine culture was negative.  She is also here to follow-up on lower extremity swelling.  She says that she noticed it on and off in her left ankle which she had an old injury but more recently she is been noticing it in her right ankle.  It usually looks better in the morning and then gets worse as the day goes on.  She does not wear any type of compression socks etc.  She is just not sure what is causing it.  She had recent labs done with Dr. Myna Hidalgo showing normal right renal function and liver function.  Past Medical History:  Diagnosis Date  . Allergy   . Anxiety   . Depression   . PE (pulmonary thromboembolism) (HCC) 05/02/2017    Past Surgical History:  Procedure Laterality Date  . CHOLECYSTECTOMY  08-2009    Family History  Problem Relation Age of Onset  . Depression Mother   . Diabetes Father   . Hypertension Father   . Hyperlipidemia Father   . Depression Brother   . Colon cancer Maternal Grandfather   . Prostate cancer Maternal Grandfather   . Melanoma Maternal Grandfather   . Lung cancer Paternal Aunt        smoker     Social History   Socioeconomic History  . Marital status: Single    Spouse name: Not on  file  . Number of children: 1  . Years of education: Not on file  . Highest education level: Not on file  Occupational History  . Occupation: Field seismologist: [employer]    Comment: [employer] [place]   Social Needs  . Financial resource strain: Not on file  . Food insecurity    Worry: Not on file    Inability: Not on file  . Transportation needs    Medical: Not on file    Non-medical: Not on file  Tobacco Use  . Smoking status: Former Smoker    Quit date: 11/14/2007    Years since quitting: 11.6  . Smokeless tobacco: Never Used  Substance and Sexual Activity  . Alcohol use: No    Comment: once every couple of months.   . Drug use: No  . Sexual activity: Yes    Partners: Male    Birth control/protection: Patch    Comment:  HS diploma, 5 caffeine drinks daily,no regular exercise.  Lifestyle  . Physical activity    Days per week: Not on file    Minutes per session: Not on file  . Stress: Not on file  Relationships  . Social connections  Talks on phone: Not on file    Gets together: Not on file    Attends religious service: Not on file    Active member of club or organization: Not on file    Attends meetings of clubs or organizations: Not on file    Relationship status: Not on file  . Intimate partner violence    Fear of current or ex partner: Not on file    Emotionally abused: Not on file    Physically abused: Not on file    Forced sexual activity: Not on file  Other Topics Concern  . Not on file  Social History Narrative   Exercise 3-4 days per week. 1-2 cups  Caffeine per day. .      Outpatient Medications Prior to Visit  Medication Sig Dispense Refill  . albuterol (PROVENTIL HFA;VENTOLIN HFA) 108 (90 BASE) MCG/ACT inhaler Inhale into the lungs every 6 (six) hours as needed for wheezing or shortness of breath.    . cetirizine (ZYRTEC) 10 MG tablet Take 10 mg by mouth daily.    . Probiotic Product (PROBIOTIC DAILY PO) Take by mouth.    Marland Kitchen  amoxicillin-clavulanate (AUGMENTIN) 875-125 MG tablet Take 1 tablet by mouth 2 (two) times daily. 10 tablet 0   No facility-administered medications prior to visit.     Allergies  Allergen Reactions  . Latex Rash  . Estrogens Other (See Comments)    Hx of PE on estrogen    ROS Review of Systems    Objective:    Physical Exam  BP (!) 109/59   Pulse 82   Ht 5\' 5"  (1.651 m)   Wt 184 lb (83.5 kg)   SpO2 99%   BMI 30.62 kg/m  Wt Readings from Last 3 Encounters:  07/02/19 184 lb (83.5 kg)  06/24/19 185 lb (83.9 kg)  05/08/19 193 lb (87.5 kg)     There are no preventive care reminders to display for this patient.  There are no preventive care reminders to display for this patient.  Lab Results  Component Value Date   TSH 0.67 03/13/2017   Lab Results  Component Value Date   WBC 8.9 05/08/2019   HGB 14.3 05/08/2019   HCT 43.7 05/08/2019   MCV 90.5 05/08/2019   PLT 284 05/08/2019   Lab Results  Component Value Date   NA 139 05/08/2019   K 4.0 05/08/2019   CO2 28 05/08/2019   GLUCOSE 109 (H) 05/08/2019   BUN 11 05/08/2019   CREATININE 0.69 05/08/2019   BILITOT 0.6 05/08/2019   ALKPHOS 61 05/08/2019   AST 25 05/08/2019   ALT 38 05/08/2019   PROT 7.0 05/08/2019   ALBUMIN 4.0 05/08/2019   CALCIUM 9.4 05/08/2019   ANIONGAP 8 05/08/2019   Lab Results  Component Value Date   CHOL 148 07/10/2018   Lab Results  Component Value Date   HDL 48 (L) 07/10/2018   Lab Results  Component Value Date   LDLCALC 82 07/10/2018   Lab Results  Component Value Date   TRIG 89 07/10/2018   Lab Results  Component Value Date   CHOLHDL 3.1 07/10/2018   Lab Results  Component Value Date   HGBA1C 5.1 07/10/2018      Assessment & Plan:   Problem List Items Addressed This Visit    None    Visit Diagnoses    Recurrent UTI    -  Primary   Relevant Orders   POCT urinalysis dipstick (Completed)   Urine  Culture   Localized swelling of both lower extremities        Relevant Orders   TSH   COMPLETE METABOLIC PANEL WITH GFR      Recurrent UTI-recommended repeat urinalysis as well as culture just to confirm that the infection has cleared she is feeling better but just not completely resolved.  If symptoms not completely resolving then consider urology referral for further work-up.  Lower extremity swelling-we will repeat renal function today as well as check her thyroid.  Suspect that she may have some element of venous stasis.  Especially because the swelling is improved in the morning and worse as the day goes on.  She does not really have any other symptoms such as shortness of breath or chest pain, etc. to indicate heart failure or other causes of lower extremity swelling.  No orders of the defined types were placed in this encounter.   Follow-up: Return if symptoms worsen or fail to improve.    Nani Gasser, MD

## 2019-07-02 NOTE — Progress Notes (Signed)
=  She has finished the ABX.  Denies f/s/c/n/v/d. No abdominal,back, or flank pain.   No itching or discharge.  She stated that its just sore.Marland KitchenMarland KitchenMarland KitchenElouise Barr, [name]

## 2019-07-03 LAB — COMPLETE METABOLIC PANEL WITH GFR
AG Ratio: 1.4 (calc) (ref 1.0–2.5)
ALT: 31 U/L — ABNORMAL HIGH (ref 6–29)
AST: 25 U/L (ref 10–30)
Albumin: 4.2 g/dL (ref 3.6–5.1)
Alkaline phosphatase (APISO): 52 U/L (ref 31–125)
BUN: 10 mg/dL (ref 7–25)
CO2: 25 mmol/L (ref 20–32)
Calcium: 9.8 mg/dL (ref 8.6–10.2)
Chloride: 102 mmol/L (ref 98–110)
Creat: 0.73 mg/dL (ref 0.50–1.10)
GFR, Est African American: 129 mL/min/{1.73_m2} (ref >=60)
GFR, Est Non African American: 111 mL/min/{1.73_m2} (ref >=60)
Globulin: 3 g/dL (calc) (ref 1.9–3.7)
Glucose, Bld: 92 mg/dL (ref 65–99)
Potassium: 4.3 mmol/L (ref 3.5–5.3)
Sodium: 138 mmol/L (ref 135–146)
Total Bilirubin: 0.6 mg/dL (ref 0.2–1.2)
Total Protein: 7.2 g/dL (ref 6.1–8.1)

## 2019-07-03 LAB — TSH: TSH: 0.68 mIU/L

## 2019-07-03 LAB — URINE CULTURE
MICRO NUMBER:: 789326
Result:: NO GROWTH
SPECIMEN QUALITY:: ADEQUATE

## 2019-08-31 IMAGING — DX DG HAND COMPLETE 3+V*R*
3 series · 3 of 3 positions shown · non-contrast
Comparison: None.

CLINICAL DATA: Contusion to the right hand, initial encounter. Pain
and bruising around the second, third and fourth metacarpal bones.

EXAM:
RIGHT HAND - COMPLETE 3+ VIEW

[hand pa]
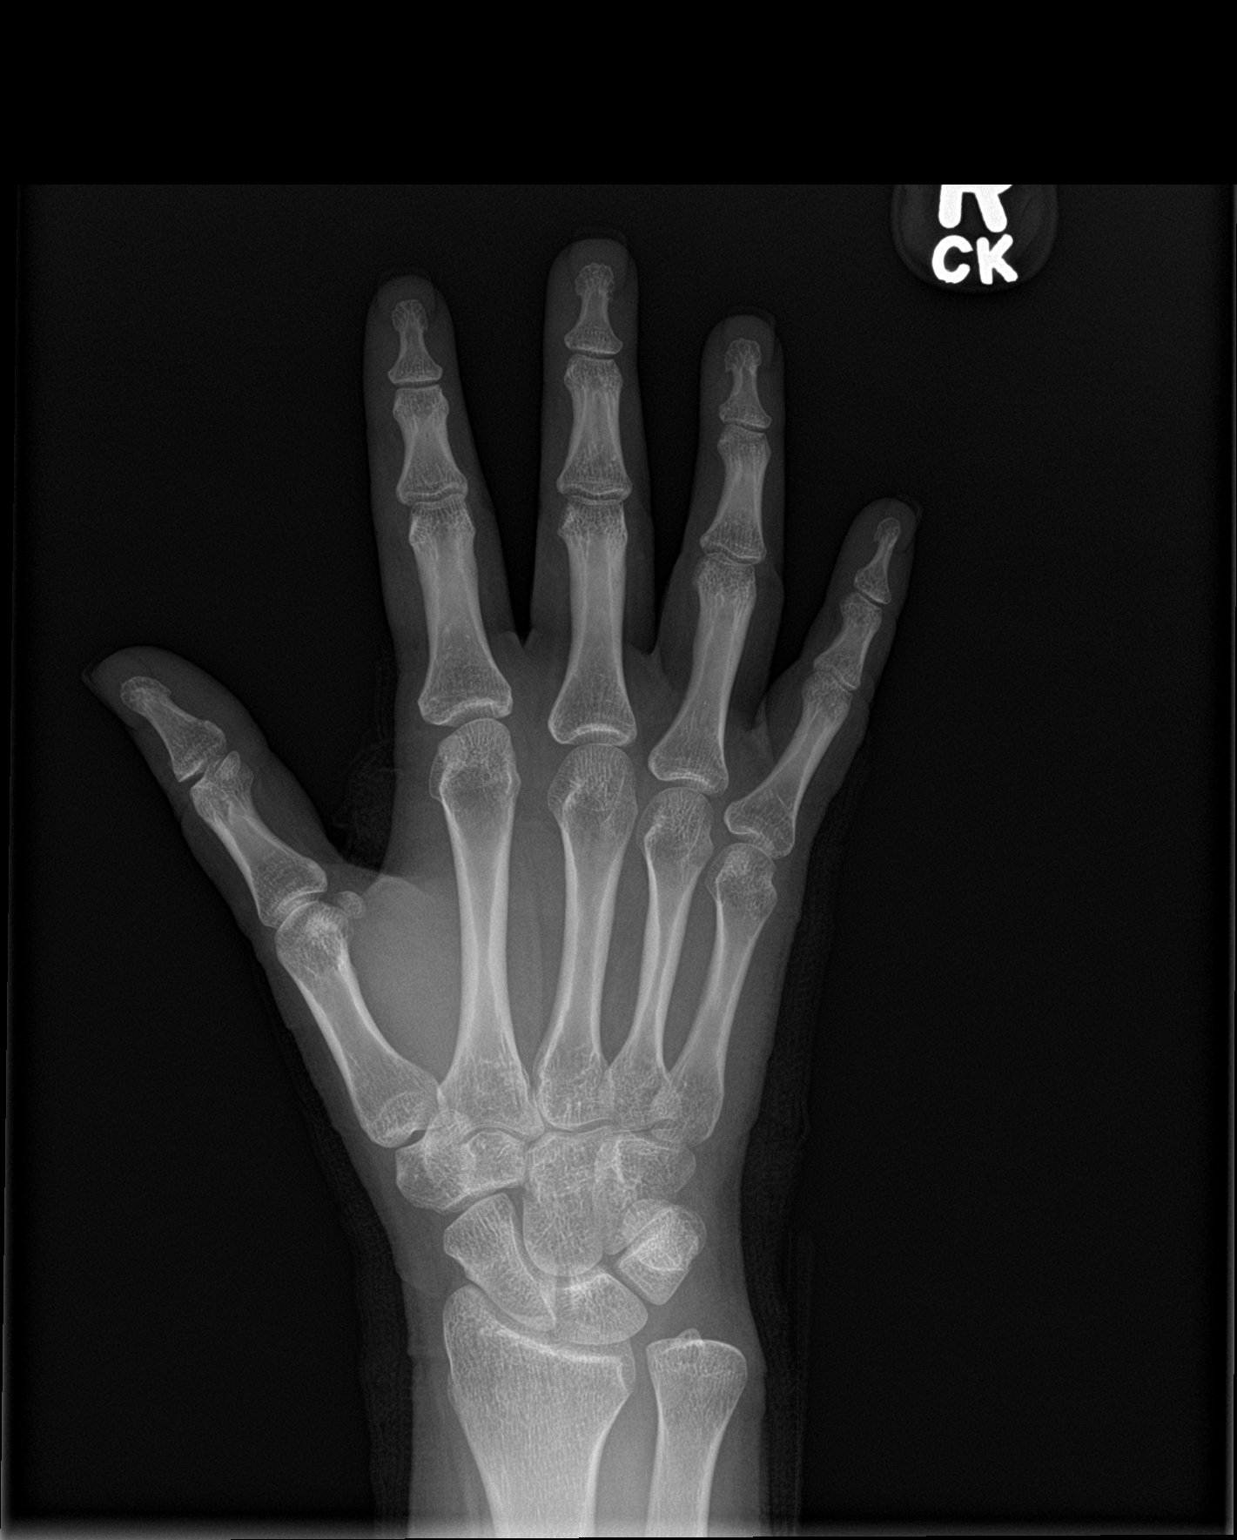

[hand obl]
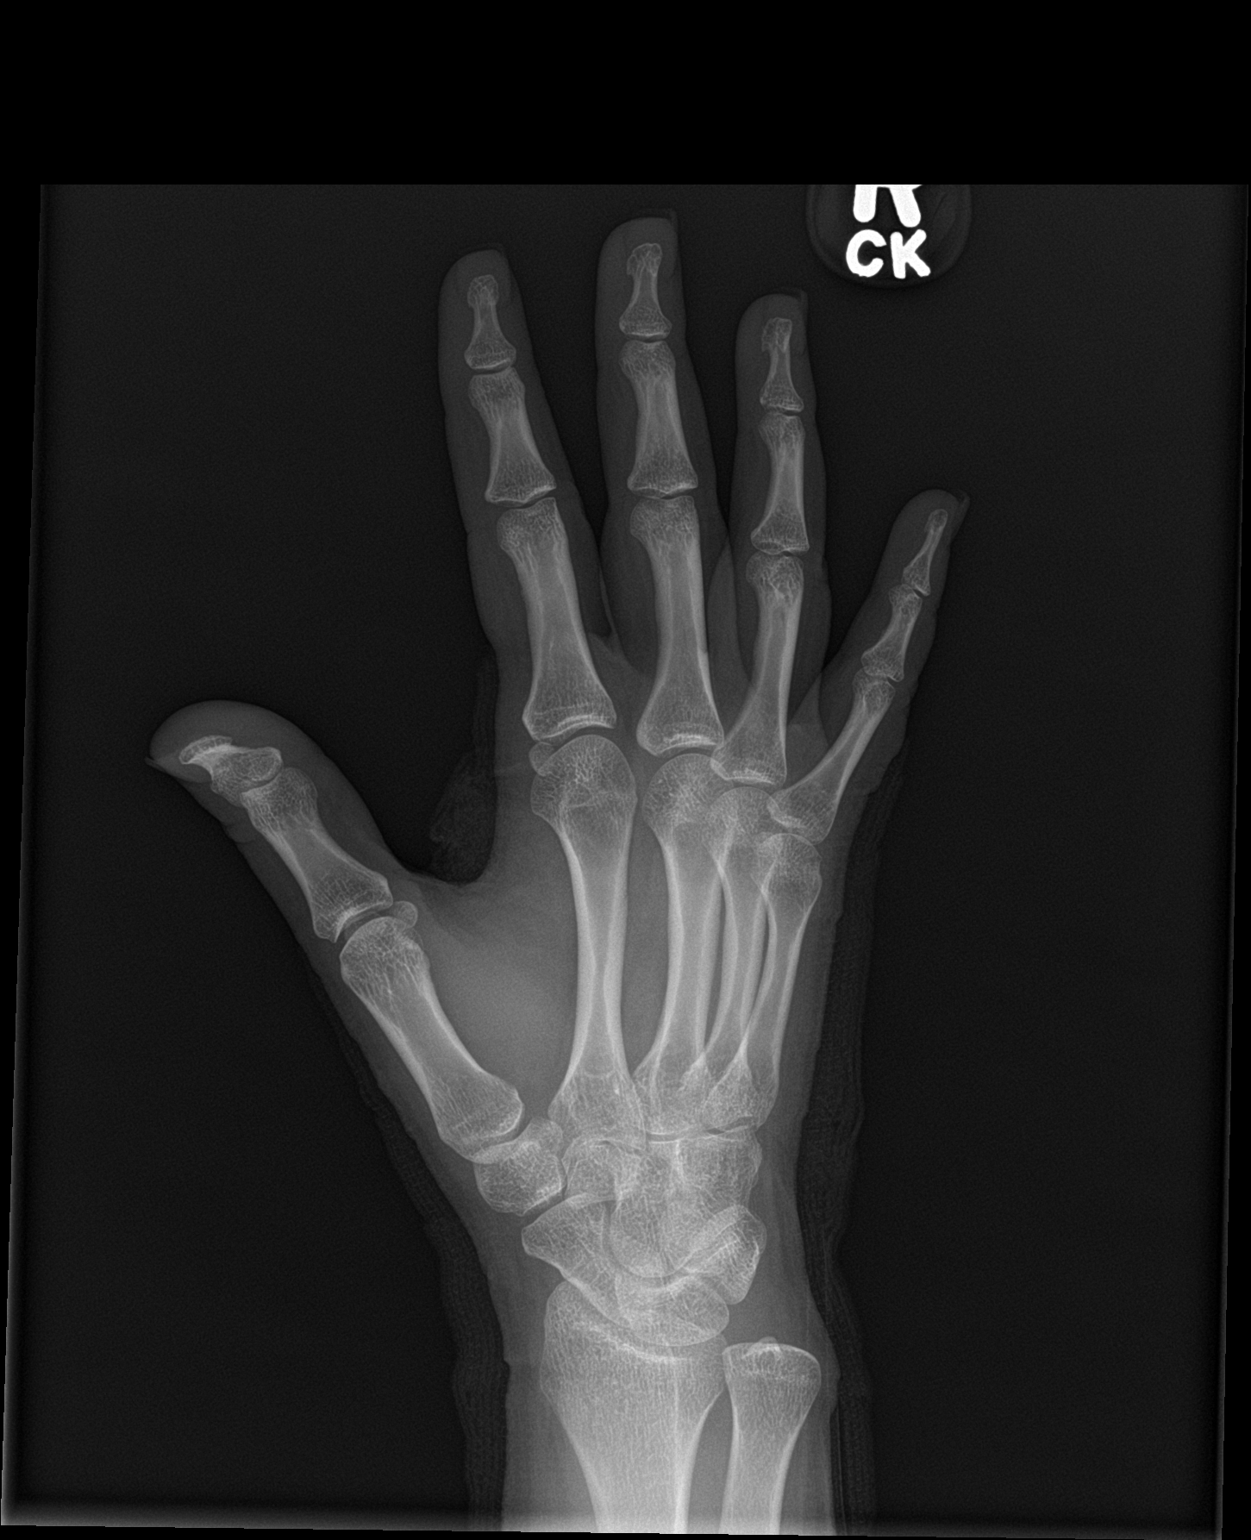

[hand lat]
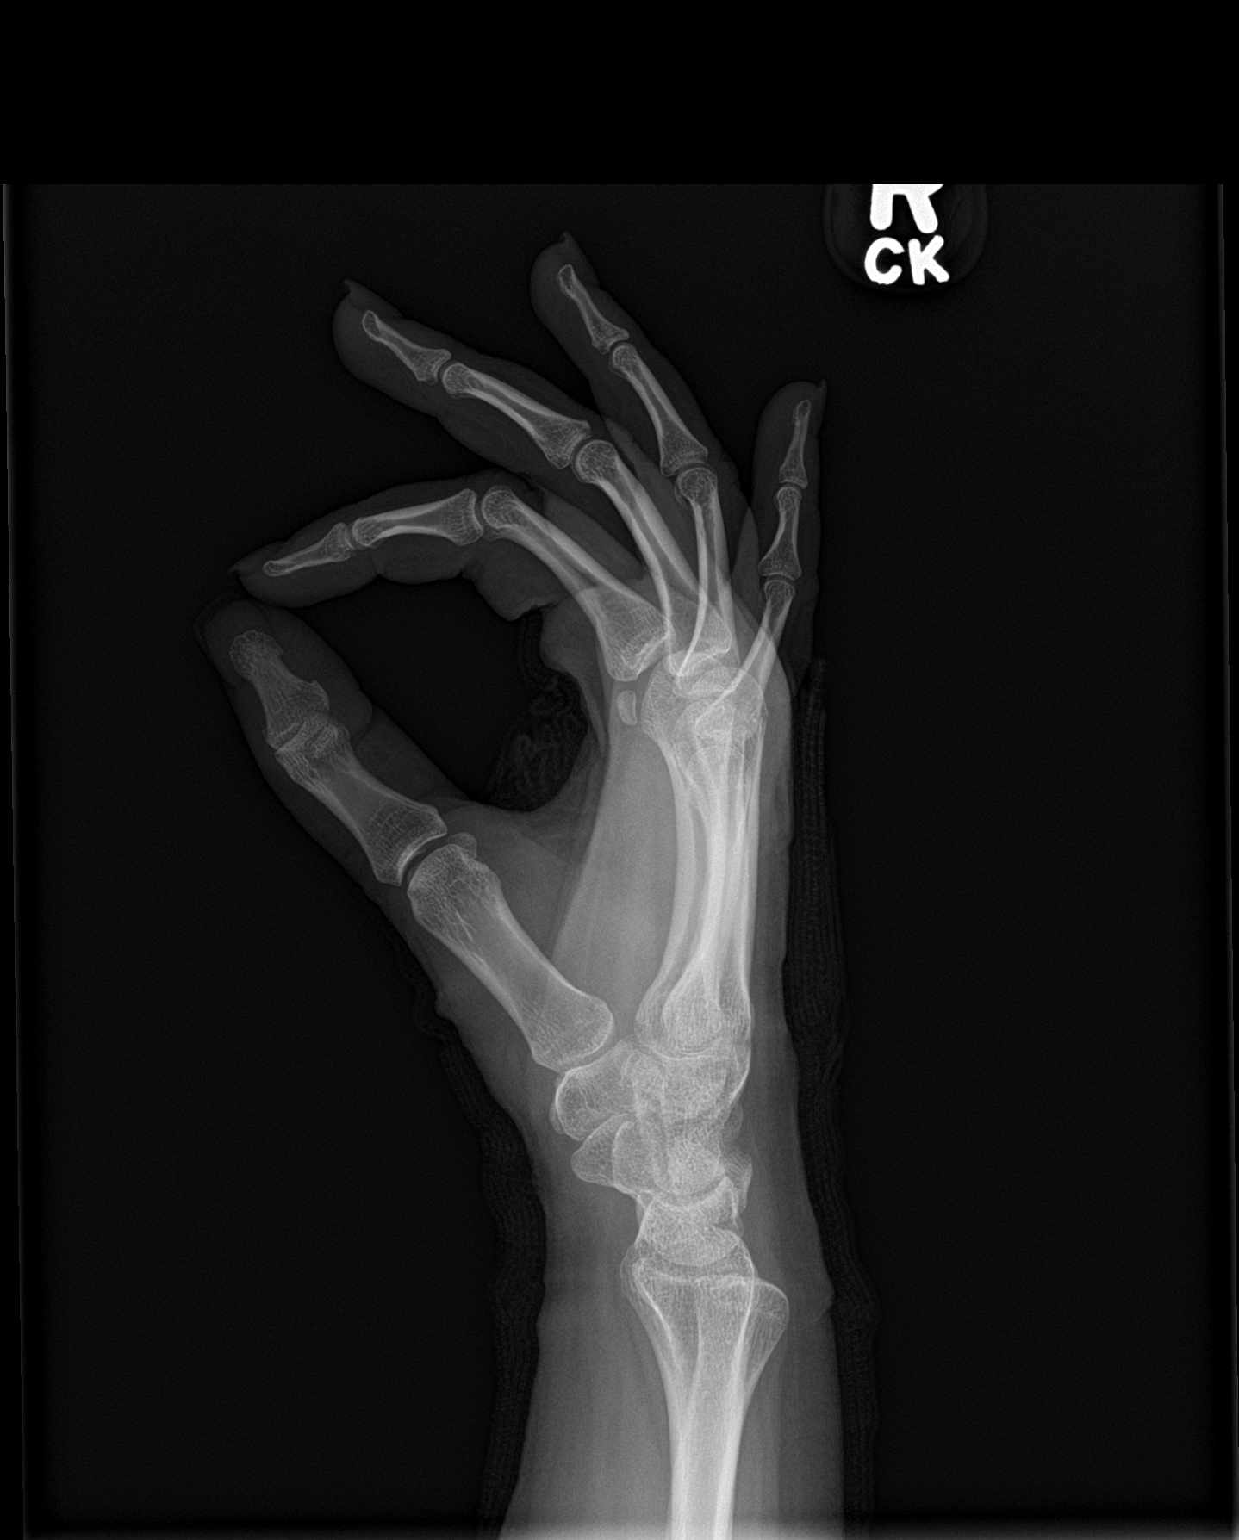

[3 of 3 positions shown; findings below may reference images not displayed]

FINDINGS: Negative for fracture or dislocation. Normal alignment of the right
hand. There is a bandage around the right hand and wrist. Normal
alignment at the right wrist.
IMPRESSION: No acute abnormality to the right hand.

## 2019-10-03 ENCOUNTER — Ambulatory Visit (INDEPENDENT_AMBULATORY_CARE_PROVIDER_SITE_OTHER): Payer: BC Managed Care – PPO | Admitting: Family Medicine

## 2019-10-03 ENCOUNTER — Encounter: Payer: Self-pay | Admitting: Family Medicine

## 2019-10-03 VITALS — Temp 97.3°F | Wt 178.0 lb

## 2019-10-03 DIAGNOSIS — J0100 Acute maxillary sinusitis, unspecified: Secondary | ICD-10-CM

## 2019-10-03 MED ORDER — AMOXICILLIN-POT CLAVULANATE 875-125 MG PO TABS: 1.0000 | ORAL_TABLET | Freq: Two times a day (BID) | ORAL | 0 refills | Status: DC

## 2019-10-03 NOTE — Progress Notes (Signed)
Virtual Visit via Video Note  I connected with [name] on 10/03/19 at  1:40 PM EST by a video enabled telemedicine application and verified that I am speaking with the correct person using two identifiers.   I discussed the limitations of evaluation and management by telemedicine and the availability of in person appointments. The patient expressed understanding and agreed to proceed.  Subjective:    CC: facial pain and congestion.    HPI: 67 you female c/o of nasal congestion with green discharge and facial pain 6- 7 days. Thought is was allergies initially. Starting with increasing sneezing.  Though she is still taking her Zyrtec daily.  Drainage is causing a sore throat.  Woke up and her has a lot of pain and pressure under both eyes and her temples.  She has green thick mucous.  No temp or chills or sweats. No COVID exposure.  No GI sxs.  Some ear pressure.  Still taking zyrtec.  Using vitamin C and D and a probiotic.  Also running her humidifier.  No worsening or alleviating factors.   Past medical history, Surgical history, Family history not pertinant except as noted below, Social history, Allergies, and medications have been entered into the medical record, reviewed, and corrections made.   Review of Systems: No fevers, chills, night sweats, weight loss, chest pain, or shortness of breath.   Objective:    General: Speaking clearly in complete sentences without any shortness of breath.  Alert and oriented x3.  Normal judgment. No apparent acute distress.    Impression and Recommendations:   Acute sinusitis -symptoms x1 week getting progressively worse.  I do think this is most consistent with a sinus infection so we will treat with Augmentin.  I did warn that she may need to consider getting tested for Covid especially with the current pandemic and rise in numbers in her local area particularly if she develops new symptoms such as cough or shortness of breath or fever.  Or if  she is just not responding to typical treatment.  Recommend nasal saline.  Okay to continue over-the-counter medications.  Also encouraged her to continue to run her humidifier.       I discussed the assessment and treatment plan with the patient. The patient was provided an opportunity to ask questions and all were answered. The patient agreed with the plan and demonstrated an understanding of the instructions.   The patient was advised to call back or seek an in-person evaluation if the symptoms worsen or if the condition fails to improve as anticipated.   Beatrice Lecher, MD

## 2019-10-03 NOTE — Telephone Encounter (Signed)
Patient will be seen sooner.

## 2019-10-06 ENCOUNTER — Encounter: Payer: Self-pay | Admitting: Family Medicine

## 2019-10-07 DIAGNOSIS — Z20828 Contact with and (suspected) exposure to other viral communicable diseases: Secondary | ICD-10-CM | POA: Diagnosis not present

## 2019-10-27 ENCOUNTER — Ambulatory Visit (INDEPENDENT_AMBULATORY_CARE_PROVIDER_SITE_OTHER): Payer: BC Managed Care – PPO

## 2019-10-27 ENCOUNTER — Other Ambulatory Visit: Payer: Self-pay

## 2019-10-27 ENCOUNTER — Encounter: Payer: Self-pay | Admitting: Sports Medicine

## 2019-10-27 ENCOUNTER — Ambulatory Visit (INDEPENDENT_AMBULATORY_CARE_PROVIDER_SITE_OTHER): Payer: BC Managed Care – PPO | Admitting: Sports Medicine

## 2019-10-27 DIAGNOSIS — M7989 Other specified soft tissue disorders: Secondary | ICD-10-CM

## 2019-10-27 DIAGNOSIS — M79641 Pain in right hand: Secondary | ICD-10-CM | POA: Diagnosis not present

## 2019-10-27 MED ORDER — PREDNISONE 50 MG PO TABS: ORAL_TABLET | ORAL | 0 refills | Status: DC

## 2019-10-27 NOTE — Progress Notes (Signed)
Subjective:    CC: Right hand swelling  HPI: This is a pleasant 28 year old female, for the past couple of weeks without a known cause that she has had pain that she localizes over the dorsum of her right hand, between the second and third metacarpals, she notes mild swelling.  Symptoms are moderate, persistent, localized radiation.  No trauma, no constitutional symptoms, she does have a strong family history of rheumatoid arthritis and autoimmune disease.  I reviewed the past medical history, family history, social history, surgical history, and allergies today and no changes were needed.  Please see the problem list section below in epic for further details.  Past Medical History: Past Medical History:  Diagnosis Date  . Allergy   . Anxiety   . Depression   . PE (pulmonary thromboembolism) (HCC) 05/02/2017   Past Surgical History: Past Surgical History:  Procedure Laterality Date  . CHOLECYSTECTOMY  08-2009   Social History: Social History   Socioeconomic History  . Marital status: Single    Spouse name: Not on file  . Number of children: 1  . Years of education: Not on file  . Highest education level: Not on file  Occupational History  . 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 Smoking status: Former Smoker    Quit date: 11/14/2007    Years since quitting: 11.9  . Smokeless tobacco: Never Used  Substance and Sexual Activity  . Alcohol use: No    Comment: once every couple of months.   . Drug use: No  . Sexual activity: Yes    Partners: Male    Birth control/protection: Patch    Comment:  HS diploma, 5 caffeine drinks daily,no regular exercise.  Other Topics Concern  . Not on file  Social History Narrative   Exercise 3-4 days per week. 1-2 cups  Caffeine per day. .     Social Determinants of Health   Financial Resource Strain:   . Difficulty of Paying Living Expenses: Not on file  Food Insecurity:     . Worried About Programme researcher, broadcasting/film/video in the Last Year: Not on file  . Ran Out of Food in the Last Year: Not on file  Transportation Needs:   . Lack of Transportation (Medical): Not on file  . Lack of Transportation (Non-Medical): Not on file  Physical Activity:   . Days of Exercise per Week: Not on file  . Minutes of Exercise per Session: Not on file  Stress:   . Feeling of Stress : Not on file  Social Connections:   . Frequency of Communication with Friends and Family: Not on file  . Frequency of Social Gatherings with Friends and Family: Not on file  . Attends Religious Services: Not on file  . Active Member of Clubs or Organizations: Not on file  . Attends Banker Meetings: Not on file  . Marital Status: Not on file   Family History: Family History  Problem Relation Age of Onset  . Depression Mother   . Diabetes Father   . Hypertension Father   . Hyperlipidemia Father   . Depression Brother   . Colon cancer Maternal Grandfather   . Prostate cancer Maternal Grandfather   . Melanoma Maternal Grandfather   . Lung cancer Paternal Aunt        smoker    Allergies: Allergies  Allergen Reactions  . Latex Rash  .  Estrogens Other (See Comments)    Hx of PE on estrogen   Medications: See med rec.  Review of Systems: No fevers, chills, night sweats, weight loss, chest pain, or shortness of breath.   Objective:    General: Well Developed, well nourished, and in no acute distress.  Neuro: Alert and oriented x3, extra-ocular muscles intact, sensation grossly intact.  HEENT: Normocephalic, atraumatic, pupils equal round reactive to light, neck supple, no masses, no lymphadenopathy, thyroid nonpalpable.  Skin: Warm and dry, no rashes. Cardiac: Regular rate and rhythm, no murmurs rubs or gallops, no lower extremity edema.  Respiratory: Clear to auscultation bilaterally. Not using accessory muscles, speaking in full sentences. Right hand: Trace fullness over the  second dorsal interossei, minimal tenderness to palpation here, excellent strength, excellent range of motion, no skin changes, no sensation changes.  Impression and Recommendations:    Swelling of right hand Swelling dorsally at the second interosseous muscle. Exam is otherwise benign, no palpable synovitis. She does have a strong family history of rheumatic disease. Considering this and some polyarthralgias we are going to pull the trigger for rheumatoid work-up, adding 5 days of prednisone, x-rays, rehab, return to see me in 4 weeks, MRI if no better.    ___________________________________________ Gwen Her. Dianah Field, M.D., ABFM., CAQSM. Primary Care and Sports Medicine [facility]  Adjunct Professor of [place] [employer]

## 2019-10-27 NOTE — Assessment & Plan Note (Signed)
Swelling dorsally at the second interosseous muscle. Exam is otherwise benign, no palpable synovitis. She does have a strong family history of rheumatic disease. Considering this and some polyarthralgias we are going to pull the trigger for rheumatoid work-up, adding 5 days of prednisone, x-rays, rehab, return to see me in 4 weeks, MRI if no better.

## 2019-10-29 LAB — CBC WITH DIFFERENTIAL/PLATELET
Absolute Monocytes: 396 cells/uL (ref 200–950)
Basophils Absolute: 28 cells/uL (ref 0–200)
Basophils Relative: 0.3 %
Eosinophils Absolute: 221 cells/uL (ref 15–500)
Eosinophils Relative: 2.4 %
HCT: 43.8 % (ref 35.0–45.0)
Hemoglobin: 14.7 g/dL (ref 11.7–15.5)
Lymphs Abs: 3468 cells/uL (ref 850–3900)
MCH: 30.1 pg (ref 27.0–33.0)
MCHC: 33.6 g/dL (ref 32.0–36.0)
MCV: 89.8 fL (ref 80.0–100.0)
MPV: 10.2 fL (ref 7.5–12.5)
Monocytes Relative: 4.3 %
Neutro Abs: 5088 cells/uL (ref 1500–7800)
Neutrophils Relative %: 55.3 %
Platelets: 288 10*3/uL (ref 140–400)
RBC: 4.88 Million/uL (ref 3.80–5.10)
RDW: 12.3 % (ref 11.0–15.0)
Total Lymphocyte: 37.7 %
WBC: 9.2 10*3/uL (ref 3.8–10.8)

## 2019-10-29 LAB — COMPREHENSIVE METABOLIC PANEL
AG Ratio: 1.3 (calc) (ref 1.0–2.5)
ALT: 19 U/L (ref 6–29)
AST: 17 U/L (ref 10–30)
Albumin: 4.2 g/dL (ref 3.6–5.1)
Alkaline phosphatase (APISO): 70 U/L (ref 31–125)
BUN: 11 mg/dL (ref 7–25)
CO2: 25 mmol/L (ref 20–32)
Calcium: 9.5 mg/dL (ref 8.6–10.2)
Chloride: 102 mmol/L (ref 98–110)
Creat: 0.76 mg/dL (ref 0.50–1.10)
Globulin: 3.2 g/dL (calc) (ref 1.9–3.7)
Glucose, Bld: 85 mg/dL (ref 65–99)
Potassium: 3.9 mmol/L (ref 3.5–5.3)
Sodium: 137 mmol/L (ref 135–146)
Total Bilirubin: 0.5 mg/dL (ref 0.2–1.2)
Total Protein: 7.4 g/dL (ref 6.1–8.1)

## 2019-10-29 LAB — ANA, IFA COMPREHENSIVE PANEL
Anti Nuclear Antibody (ANA): POSITIVE — AB
ENA SM Ab Ser-aCnc: <1 AI
SM/RNP: <1 AI
SSA (Ro) (ENA) Antibody, IgG: <1 AI
SSB (La) (ENA) Antibody, IgG: <1 AI
Scleroderma (Scl-70) (ENA) Antibody, IgG: <1 AI
ds DNA Ab: 1 [IU]/mL

## 2019-10-29 LAB — ANTI-NUCLEAR AB-TITER (ANA TITER): ANA Titer 1: 1:80 {titer} — ABNORMAL HIGH

## 2019-10-29 LAB — CK: Total CK: 57 U/L (ref 29–143)

## 2019-10-29 LAB — URIC ACID: Uric Acid, Serum: 5.9 mg/dL (ref 2.5–7.0)

## 2019-10-29 LAB — SEDIMENTATION RATE: Sed Rate: 11 mm/h (ref 0–20)

## 2019-11-24 ENCOUNTER — Other Ambulatory Visit: Payer: Self-pay

## 2019-11-24 ENCOUNTER — Ambulatory Visit: Payer: BC Managed Care – PPO | Admitting: Sports Medicine

## 2019-11-24 DIAGNOSIS — M069 Rheumatoid arthritis, unspecified: Secondary | ICD-10-CM | POA: Diagnosis not present

## 2019-11-24 DIAGNOSIS — M7989 Other specified soft tissue disorders: Secondary | ICD-10-CM

## 2019-11-24 DIAGNOSIS — M609 Myositis, unspecified: Secondary | ICD-10-CM | POA: Diagnosis not present

## 2019-11-24 MED ORDER — MELOXICAM 15 MG PO TABS: ORAL_TABLET | ORAL | 3 refills | Status: DC

## 2019-11-24 NOTE — Progress Notes (Signed)
    Procedures performed today:    None.  Independent interpretation of tests performed by another provider:   None.  Impression and Recommendations:    Swelling of right hand Pleasant 29 year old female returns for further discussion of her polyarthralgias and right hand swelling. Continues to have some swelling of the second dorsal interosseous muscle, symptoms did improve considerably with prednisone. At this point we are going to proceed with an MRI of the right hand, she has failed greater than 6 weeks of physician directed conservative measures. She did not get her rheumatoid factor and CCP done, reordering this. Adding meloxicam as well. Her symptoms are chronic and not at goal. Return to see me in 1 month.    ___________________________________________ Ihor Austin. Benjamin Stain, M.D., ABFM., CAQSM. Primary Care and Sports Medicine [facility]  Adjunct Instructor of Family Medicine  [employer]

## 2019-11-24 NOTE — Assessment & Plan Note (Signed)
Pleasant 29 year old female returns for further discussion of her polyarthralgias and right hand swelling. Continues to have some swelling of the second dorsal interosseous muscle, symptoms did improve considerably with prednisone. At this point we are going to proceed with an MRI of the right hand, she has failed greater than 6 weeks of physician directed conservative measures. She did not get her rheumatoid factor and CCP done, reordering this. Adding meloxicam as well. Her symptoms are chronic and not at goal. Return to see me in 1 month.

## 2019-11-25 ENCOUNTER — Other Ambulatory Visit: Payer: BC Managed Care – PPO

## 2019-12-07 ENCOUNTER — Other Ambulatory Visit: Payer: BC Managed Care – PPO

## 2019-12-23 ENCOUNTER — Ambulatory Visit: Payer: BC Managed Care – PPO | Admitting: Sports Medicine

## 2020-03-16 DIAGNOSIS — H52223 Regular astigmatism, bilateral: Secondary | ICD-10-CM | POA: Diagnosis not present

## 2020-06-16 IMAGING — DX DG HAND COMPLETE 3+V*R*
3 series · 3 of 3 positions shown · non-contrast
Comparison: None.

CLINICAL DATA: Pain and swelling at the second digit level

EXAM:
RIGHT HAND - COMPLETE 3+ VIEW

[hand pa]
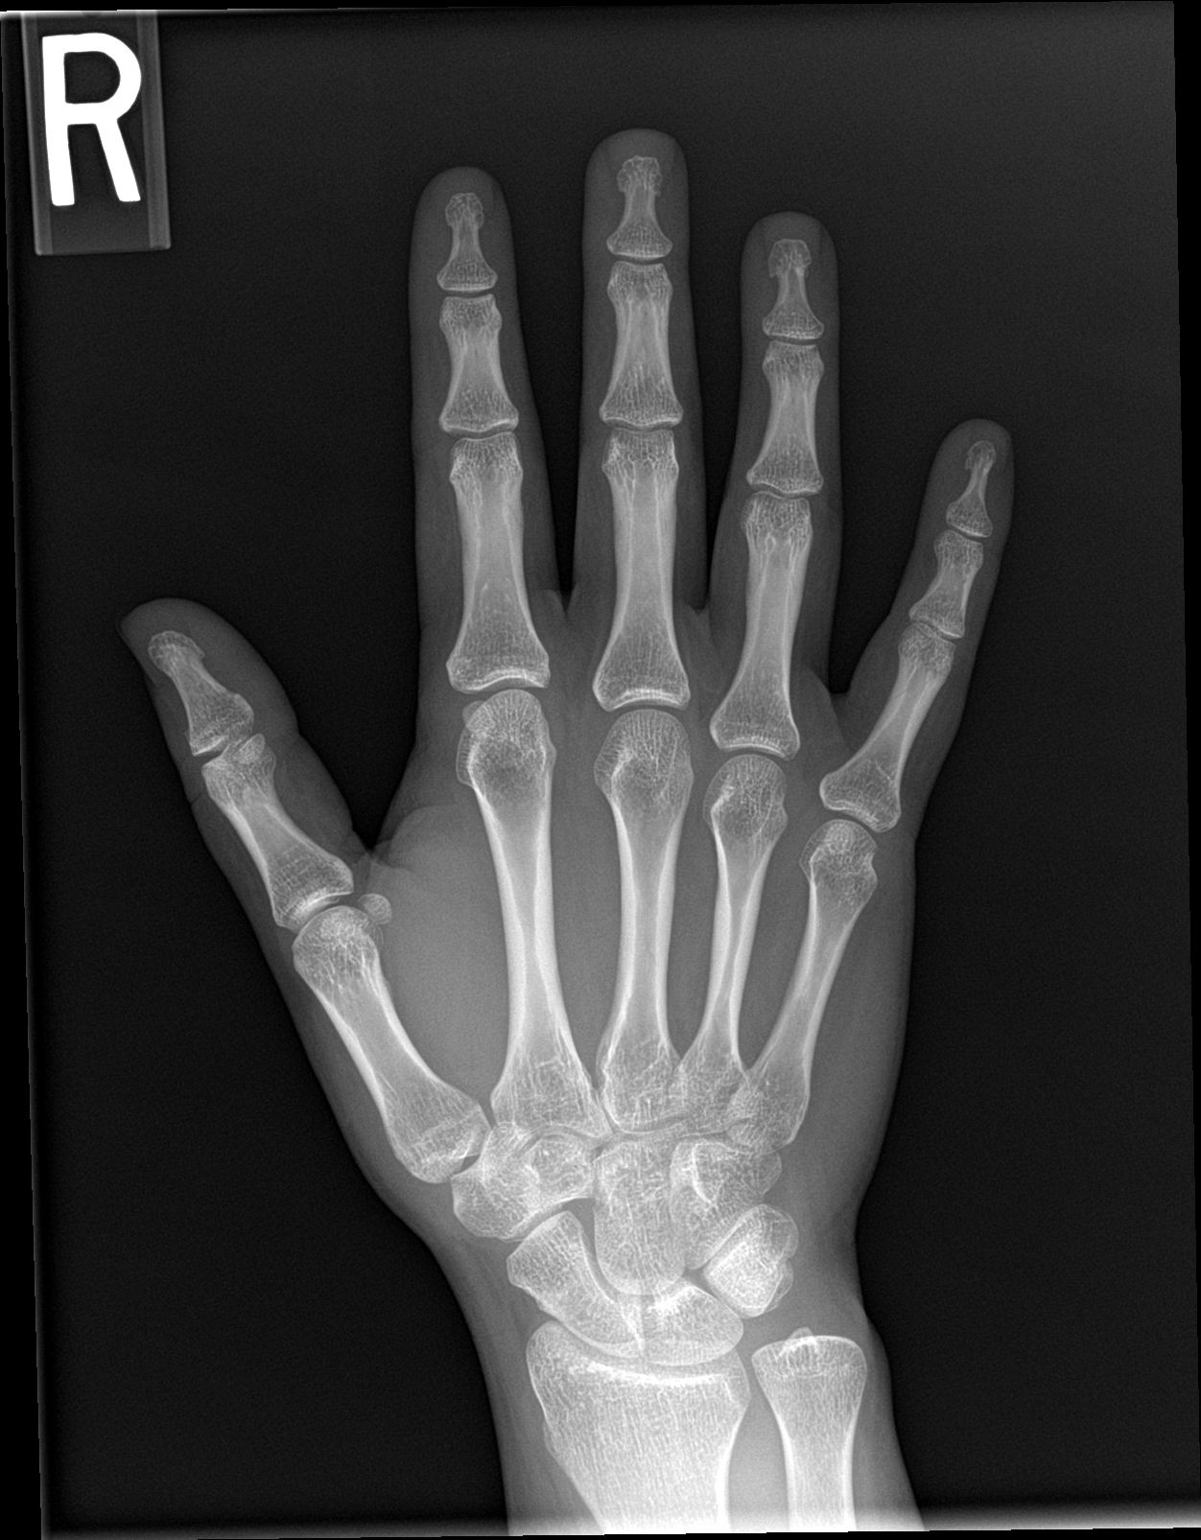

[hand obl]
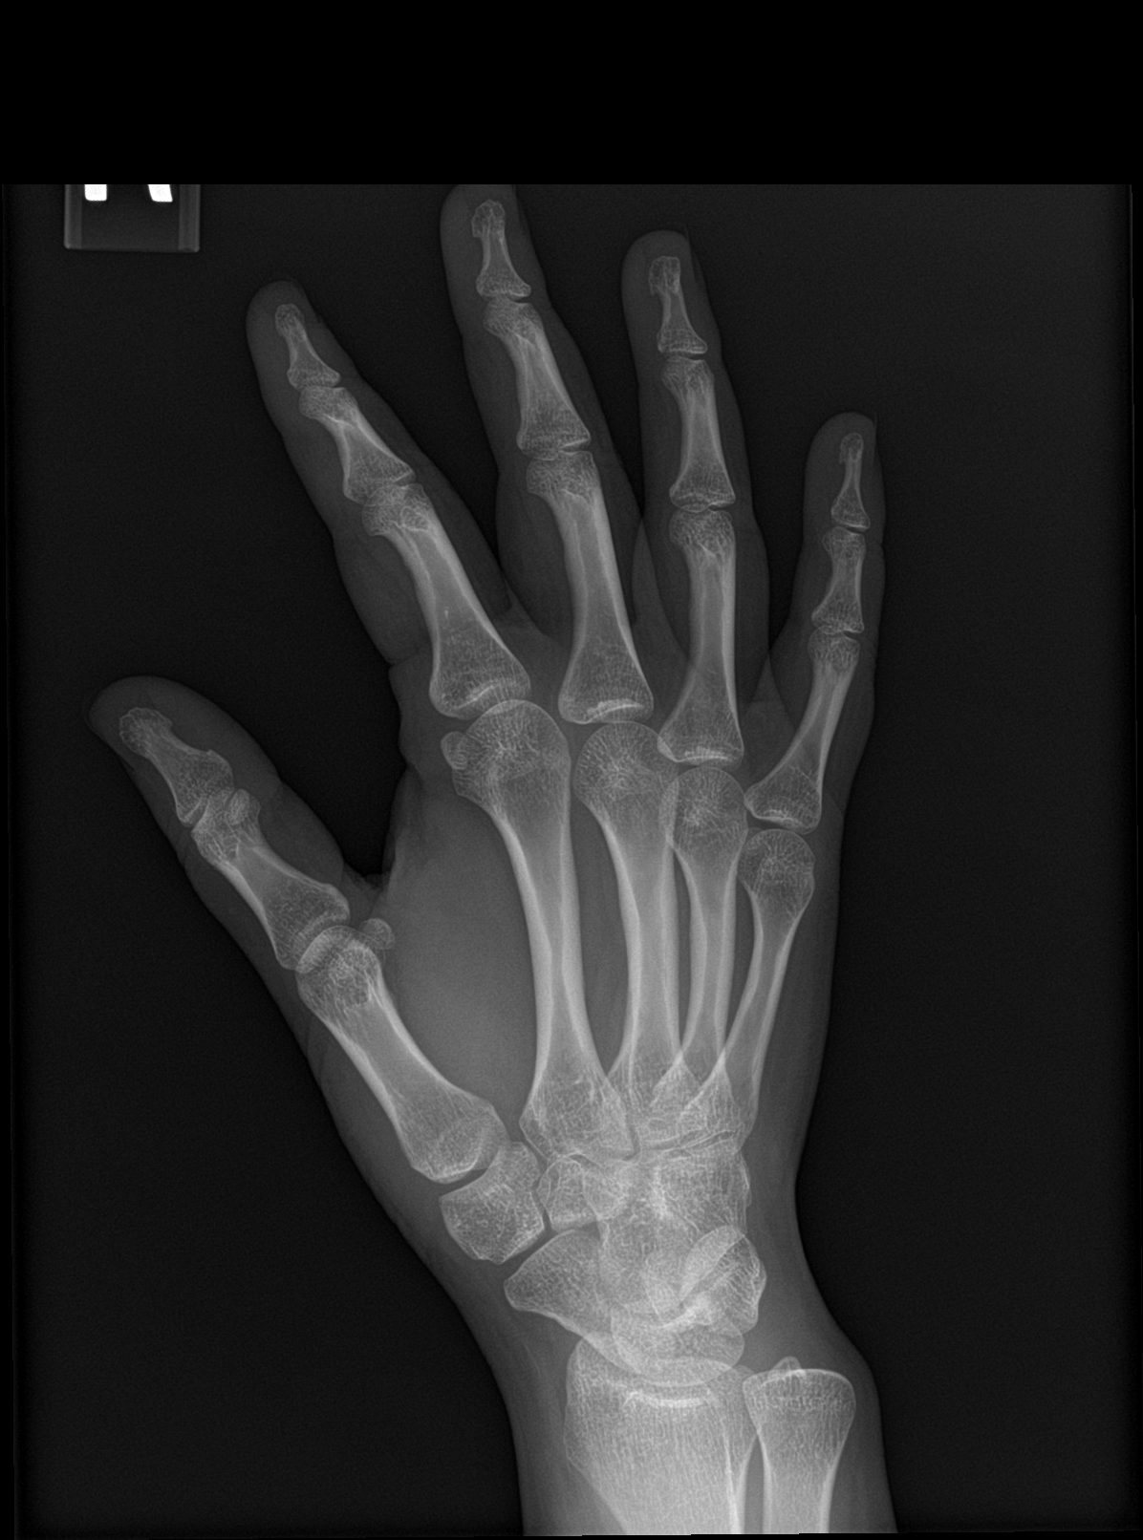

[hand lat]
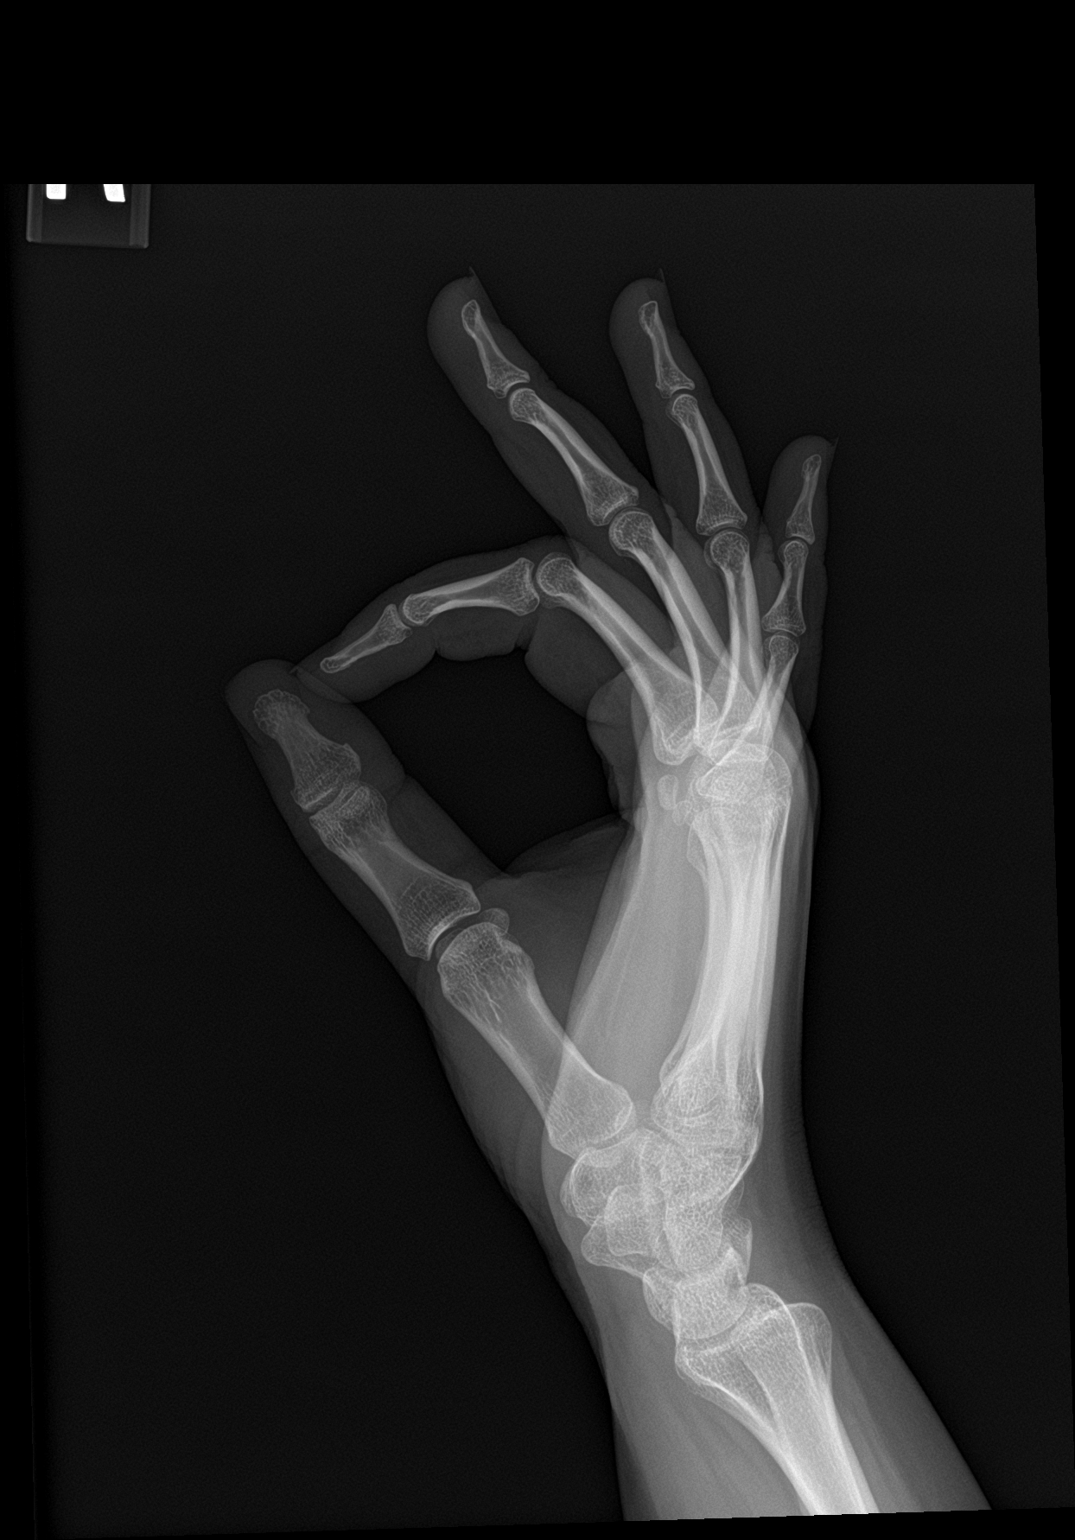

[3 of 3 positions shown; findings below may reference images not displayed]

FINDINGS: Frontal, oblique, and lateral views were obtained. No appreciable
fracture or dislocation. Joint spaces appear normal. No erosive
change or abnormal calcification. Bony mineralization is normal.
IMPRESSION: No abnormality noted.

## 2020-07-07 ENCOUNTER — Other Ambulatory Visit: Payer: Self-pay

## 2020-07-07 ENCOUNTER — Ambulatory Visit (INDEPENDENT_AMBULATORY_CARE_PROVIDER_SITE_OTHER): Payer: BC Managed Care – PPO

## 2020-07-07 DIAGNOSIS — Z3200 Encounter for pregnancy test, result unknown: Secondary | ICD-10-CM | POA: Diagnosis not present

## 2020-07-07 LAB — POCT URINE PREGNANCY: Preg Test, Ur: NEGATIVE

## 2020-07-07 NOTE — Progress Notes (Addendum)
Pt here for UPT to confirm. Pt states she took two home UPT. One was positive and one was negative. Pt does state she has been nauseous. Pt does not have a period so cannot determine LMP. UPT in office negative. B-HCG drawn today in office.

## 2020-07-08 ENCOUNTER — Encounter: Payer: Self-pay | Admitting: *Deleted

## 2020-07-08 LAB — HCG, QUANTITATIVE, PREGNANCY: HCG, Total, QN: <3 m[IU]/mL

## 2020-07-13 ENCOUNTER — Encounter: Payer: Self-pay | Admitting: Medical-Surgical

## 2020-07-13 ENCOUNTER — Telehealth (INDEPENDENT_AMBULATORY_CARE_PROVIDER_SITE_OTHER): Payer: BC Managed Care – PPO | Admitting: Medical-Surgical

## 2020-07-13 VITALS — Temp 97.2°F

## 2020-07-13 DIAGNOSIS — R112 Nausea with vomiting, unspecified: Secondary | ICD-10-CM

## 2020-07-13 MED ORDER — ONDANSETRON 8 MG PO TBDP: 8.0000 mg | ORAL_TABLET | Freq: Three times a day (TID) | ORAL | 0 refills | Status: DC | PRN

## 2020-07-13 NOTE — Progress Notes (Signed)
Virtual Visit via Video Note  I connected with [name] on 07/13/20 at 11:10 AM EDT by a video enabled telemedicine application and verified that I am speaking with the correct person using two identifiers.   I discussed the limitations of evaluation and management by telemedicine and the availability of in person appointments. The patient expressed understanding and agreed to proceed.  Patient location: home Provider locations: office  Subjective:    CC: nausea/vomiting  HPI: Pleasant 29 year old female presenting via MyChart with complaints of nausea with vomiting intermittently for the past 1.5 weeks. She has also experienced some intermittent fatigue and dizziness. Nausea is worse in the morning but has occurred in the evening. Vomiting has only occurred in the morning. Emesis is mainly mucus and stomach acid, no blood visualized. Has had increased clear vaginal discharge. No breast tenderness. Has not been using birth control for a while, menses irregular. Sexually active, not using condoms. Took a home UPT last week with one positive result, took a second with negative result. Saw her OB/GYN last week with negative UPT and serum beta-HCG. Symptoms have continued and she has vomited daily for the past 4 days. No history or recent trouble with reflux. No abdominal pain, tenderness, bloating, or diarrhea. No blood in her stool. Able to eat and drink okay but her appetite is noticeably decreased. Starts eating at times and stops after a few bites because she no longer wants it, then gets nauseated. Has been treating nausea using peppermint beads. No known sick contacts and no recent new restaurants/foods.  Past medical history, Surgical history, Family history not pertinant except as noted below, Social history, Allergies, and medications have been entered into the medical record, reviewed, and corrections made.   Review of Systems: See HPI for pertinent positives and negatives.    Objective:    General: Speaking clearly in complete sentences without any shortness of breath.  Alert and oriented x3.  Normal judgment. No apparent acute distress.  Impression and Recommendations:    1. Non-intractable vomiting with nausea, unspecified vomiting type Checking CBC with diff, CMP, and repeating beta-HCG. Zofran 8mg  ODT every 8 hours as needed for nausea. Recommend bland diet and regular fluids.  - CBC with Differential/Platelet - COMPLETE METABOLIC PANEL WITH GFR - B-HCG Quant       I discussed the assessment and treatment plan with the patient. The patient was provided an opportunity to ask questions and all were answered. The patient agreed with the plan and demonstrated an understanding of the instructions.   The patient was advised to call back or seek an in-person evaluation if the symptoms worsen or if the condition fails to improve as anticipated.   , DNP, APRN, FNP-BC [facility] and Sports Medicine

## 2020-07-14 LAB — CBC WITH DIFFERENTIAL/PLATELET
Absolute Monocytes: 340 cells/uL (ref 200–950)
Basophils Absolute: 42 cells/uL (ref 0–200)
Basophils Relative: 0.5 %
Eosinophils Absolute: 141 cells/uL (ref 15–500)
Eosinophils Relative: 1.7 %
HCT: 42.1 % (ref 35.0–45.0)
Hemoglobin: 14.2 g/dL (ref 11.7–15.5)
Lymphs Abs: 2706 cells/uL (ref 850–3900)
MCH: 29.9 pg (ref 27.0–33.0)
MCHC: 33.7 g/dL (ref 32.0–36.0)
MCV: 88.6 fL (ref 80.0–100.0)
MPV: 10.2 fL (ref 7.5–12.5)
Monocytes Relative: 4.1 %
Neutro Abs: 5071 cells/uL (ref 1500–7800)
Neutrophils Relative %: 61.1 %
Platelets: 314 10*3/uL (ref 140–400)
RBC: 4.75 10*6/uL (ref 3.80–5.10)
RDW: 12.3 % (ref 11.0–15.0)
Total Lymphocyte: 32.6 %
WBC: 8.3 10*3/uL (ref 3.8–10.8)

## 2020-07-14 LAB — COMPLETE METABOLIC PANEL WITH GFR
AG Ratio: 1.4 (calc) (ref 1.0–2.5)
ALT: 36 U/L — ABNORMAL HIGH (ref 6–29)
AST: 21 U/L (ref 10–30)
Albumin: 4.2 g/dL (ref 3.6–5.1)
Alkaline phosphatase (APISO): 67 U/L (ref 31–125)
BUN: 10 mg/dL (ref 7–25)
CO2: 27 mmol/L (ref 20–32)
Calcium: 9.4 mg/dL (ref 8.6–10.2)
Chloride: 104 mmol/L (ref 98–110)
Creat: 0.87 mg/dL (ref 0.50–1.10)
GFR, Est African American: 104 mL/min/{1.73_m2} (ref >=60)
GFR, Est Non African American: 89 mL/min/{1.73_m2} (ref >=60)
Globulin: 2.9 g/dL (calc) (ref 1.9–3.7)
Glucose, Bld: 130 mg/dL — ABNORMAL HIGH (ref 65–99)
Potassium: 3.8 mmol/L (ref 3.5–5.3)
Sodium: 138 mmol/L (ref 135–146)
Total Bilirubin: 0.6 mg/dL (ref 0.2–1.2)
Total Protein: 7.1 g/dL (ref 6.1–8.1)

## 2020-07-14 LAB — HCG, QUANTITATIVE, PREGNANCY: HCG, Total, QN: <3 m[IU]/mL

## 2020-07-28 DIAGNOSIS — N911 Secondary amenorrhea: Secondary | ICD-10-CM | POA: Diagnosis not present

## 2020-08-10 LAB — HM PAP SMEAR

## 2020-08-30 ENCOUNTER — Other Ambulatory Visit: Payer: Self-pay

## 2020-08-30 ENCOUNTER — Encounter: Payer: Self-pay | Admitting: Family Medicine

## 2020-08-30 ENCOUNTER — Ambulatory Visit (INDEPENDENT_AMBULATORY_CARE_PROVIDER_SITE_OTHER): Payer: BC Managed Care – PPO | Admitting: Family Medicine

## 2020-08-30 VITALS — BP 118/62 | HR 83 | Ht 65.0 in | Wt 188.0 lb

## 2020-08-30 DIAGNOSIS — Z0000 Encounter for general adult medical examination without abnormal findings: Secondary | ICD-10-CM | POA: Diagnosis not present

## 2020-08-30 NOTE — Progress Notes (Signed)
Subjective:     [name] is a 29 y.o. female and is here for a comprehensive physical exam. The patient reports no problems.  She saw her GYN about a month ago and did have her Pap smear performed.  She is doing well. Her daughter is here with her today. She is working and home schooling now.    Social History   Socioeconomic History  . Marital status: Single    Spouse name: Not on file  . Number of children: 1  . Years of education: Not on file  . Highest education level: Not on file  Occupational History  . 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 Smoking status: Former Smoker    Quit date: 11/14/2007    Years since quitting: 12.8  . Smokeless tobacco: Never Used  Vaping Use  . Vaping Use: Never used  Substance and Sexual Activity  . Alcohol use: No    Comment: once every couple of months.   . Drug use: No  . Sexual activity: Yes    Partners: Male    Birth control/protection: Patch    Comment:  HS diploma, 5 caffeine drinks daily,no regular exercise.  Other Topics Concern  . Not on file  Social History Narrative   Exercise 3-4 days per week. 1-2 cups  Caffeine per day. .     Social Determinants of Health   Financial Resource Strain:   . Difficulty of Paying Living Expenses: Not on file  Food Insecurity:   . Worried About Programme researcher, broadcasting/film/video in the Last Year: Not on file  . Ran Out of Food in the Last Year: Not on file  Transportation Needs:   . Lack of Transportation (Medical): Not on file  . Lack of Transportation (Non-Medical): Not on file  Physical Activity:   . Days of Exercise per Week: Not on file  . Minutes of Exercise per Session: Not on file  Stress:   . Feeling of Stress : Not on file  Social Connections:   . Frequency of Communication with Friends and Family: Not on file  . Frequency of Social Gatherings with Friends and Family: Not on file  . Attends Religious Services: Not on file  .  Active Member of Clubs or Organizations: Not on file  . Attends Banker Meetings: Not on file  . Marital Status: Not on file  Intimate Partner Violence:   . Fear of Current or Ex-Partner: Not on file  . Emotionally Abused: Not on file  . Physically Abused: Not on file  . Sexually Abused: Not on file   Health Maintenance  Topic Date Due  . Hepatitis C Screening  Never done  . INFLUENZA VACCINE  02/10/2021 (Originally 06/13/2020)  . COVID-19 Vaccine (1) 09/15/2021 (Originally 03/28/2002)  . PAP SMEAR-Modifier  08/11/2023  . TETANUS/TDAP  01/17/2028  . HIV Screening  Completed    The following portions of the patient's history were reviewed and updated as appropriate: allergies, current medications, past family history, past medical history, past social history, past surgical history and problem list.  Review of Systems A comprehensive review of systems was negative.   Objective:    BP 118/62   Pulse 83   Ht 5\' 5"  (1.651 m)   Wt 188 lb (85.3 kg)   SpO2 98%   BMI 31.28 kg/m  General appearance: alert, cooperative and appears stated age  Head: Normocephalic, without obvious abnormality, atraumatic Eyes: conj clear, EOMI, PEERLA Ears: normal TM's and external ear canals both ears Nose: Nares normal. Septum midline. Mucosa normal. No drainage or sinus tenderness. Throat: lips, mucosa, and tongue normal; teeth and gums normal Neck: no adenopathy, no carotid bruit, no JVD, supple, symmetrical, trachea midline and thyroid not enlarged, symmetric, no tenderness/mass/nodules Back: symmetric, no curvature. ROM normal. No CVA tenderness. Lungs: clear to auscultation bilaterally Heart: regular rate and rhythm, S1, S2 normal, no murmur, click, rub or gallop Abdomen: soft, non-tender; bowel sounds normal; no masses,  no organomegaly Extremities: extremities normal, atraumatic, no cyanosis or edema Pulses: 2+ and symmetric Skin: Skin color, texture, turgor normal. No rashes or  lesions Lymph nodes: Cervical, supraclavicular, and axillary nodes normal. Neurologic: Alert and oriented X 3, normal strength and tone. Normal symmetric reflexes. Normal coordination and gait    Assessment:    Healthy female exam.     Plan:     See After Visit Summary for Counseling Recommendations    Keep up a regular exercise program and make sure you are eating a healthy diet Try to eat 4 servings of dairy a day, or if you are lactose intolerant take a calcium with vitamin D daily.  Your vaccines are up to date.  Due for labs. Will need A1C for work form. She will drop it off later this week.

## 2020-08-31 ENCOUNTER — Encounter: Payer: Self-pay | Admitting: Family Medicine

## 2020-09-02 DIAGNOSIS — Z0000 Encounter for general adult medical examination without abnormal findings: Secondary | ICD-10-CM | POA: Diagnosis not present

## 2020-09-03 LAB — HEMOGLOBIN A1C
Hgb A1c MFr Bld: 5 % of total Hgb (ref <5.7)
Mean Plasma Glucose: 97 (calc)
eAG (mmol/L): 5.4 (calc)

## 2020-09-03 LAB — COMPLETE METABOLIC PANEL WITH GFR
AG Ratio: 1.4 (calc) (ref 1.0–2.5)
ALT: 23 U/L (ref 6–29)
AST: 19 U/L (ref 10–30)
Albumin: 4.2 g/dL (ref 3.6–5.1)
Alkaline phosphatase (APISO): 59 U/L (ref 31–125)
BUN: 9 mg/dL (ref 7–25)
CO2: 22 mmol/L (ref 20–32)
Calcium: 9.5 mg/dL (ref 8.6–10.2)
Chloride: 107 mmol/L (ref 98–110)
Creat: 0.7 mg/dL (ref 0.50–1.10)
GFR, Est African American: 135 mL/min/{1.73_m2} (ref >=60)
GFR, Est Non African American: 116 mL/min/{1.73_m2} (ref >=60)
Globulin: 3 g/dL (calc) (ref 1.9–3.7)
Glucose, Bld: 80 mg/dL (ref 65–99)
Potassium: 4.3 mmol/L (ref 3.5–5.3)
Sodium: 140 mmol/L (ref 135–146)
Total Bilirubin: 0.6 mg/dL (ref 0.2–1.2)
Total Protein: 7.2 g/dL (ref 6.1–8.1)

## 2020-09-03 LAB — LIPID PANEL W/REFLEX DIRECT LDL
Cholesterol: 167 mg/dL (ref <200)
HDL: 46 mg/dL — ABNORMAL LOW (ref >=50)
LDL Cholesterol (Calc): 102 mg/dL (calc) — ABNORMAL HIGH
Non-HDL Cholesterol (Calc): 121 mg/dL (calc) (ref <130)
Total CHOL/HDL Ratio: 3.6 (calc) (ref <5.0)
Triglycerides: 96 mg/dL (ref <150)

## 2020-09-10 DIAGNOSIS — N911 Secondary amenorrhea: Secondary | ICD-10-CM | POA: Diagnosis not present

## 2020-09-10 DIAGNOSIS — E282 Polycystic ovarian syndrome: Secondary | ICD-10-CM | POA: Diagnosis not present

## 2020-12-31 DIAGNOSIS — Z20822 Contact with and (suspected) exposure to covid-19: Secondary | ICD-10-CM | POA: Diagnosis not present

## 2020-12-31 DIAGNOSIS — Z03818 Encounter for observation for suspected exposure to other biological agents ruled out: Secondary | ICD-10-CM | POA: Diagnosis not present

## 2021-01-10 ENCOUNTER — Telehealth: Payer: Self-pay | Admitting: Family Medicine

## 2021-01-10 DIAGNOSIS — R059 Cough, unspecified: Secondary | ICD-10-CM | POA: Diagnosis not present

## 2021-01-10 DIAGNOSIS — R079 Chest pain, unspecified: Secondary | ICD-10-CM | POA: Diagnosis not present

## 2021-01-10 DIAGNOSIS — Z91040 Latex allergy status: Secondary | ICD-10-CM | POA: Diagnosis not present

## 2021-01-10 DIAGNOSIS — Z888 Allergy status to other drugs, medicaments and biological substances status: Secondary | ICD-10-CM | POA: Diagnosis not present

## 2021-01-10 DIAGNOSIS — R5383 Other fatigue: Secondary | ICD-10-CM | POA: Diagnosis not present

## 2021-01-10 DIAGNOSIS — Z79899 Other long term (current) drug therapy: Secondary | ICD-10-CM | POA: Diagnosis not present

## 2021-01-10 DIAGNOSIS — R0789 Other chest pain: Secondary | ICD-10-CM | POA: Diagnosis not present

## 2021-01-10 NOTE — Telephone Encounter (Signed)
Patient called in stating she has chest tightness for a week. Also has a history of blood clots. Spoke to triage nurse and was advised for patient to go to the ER since nothing open today. Patient did not want to go and wanted to speak to a nurse. She was transferred over after being advised.

## 2021-01-10 NOTE — Telephone Encounter (Signed)
Agree with above 

## 2021-04-13 DIAGNOSIS — R059 Cough, unspecified: Secondary | ICD-10-CM | POA: Diagnosis not present

## 2021-04-13 DIAGNOSIS — R062 Wheezing: Secondary | ICD-10-CM | POA: Diagnosis not present

## 2021-07-14 ENCOUNTER — Encounter: Payer: Self-pay | Admitting: Family Medicine

## 2021-07-14 ENCOUNTER — Other Ambulatory Visit: Payer: Self-pay

## 2021-07-14 ENCOUNTER — Ambulatory Visit (INDEPENDENT_AMBULATORY_CARE_PROVIDER_SITE_OTHER): Payer: Self-pay | Admitting: Family Medicine

## 2021-07-14 VITALS — BP 122/77 | HR 91 | Ht 65.0 in | Wt 202.0 lb

## 2021-07-14 DIAGNOSIS — Z0000 Encounter for general adult medical examination without abnormal findings: Secondary | ICD-10-CM

## 2021-07-14 DIAGNOSIS — F411 Generalized anxiety disorder: Secondary | ICD-10-CM

## 2021-07-14 NOTE — Progress Notes (Signed)
Subjective:     [name] is a 30 y.o. female and is here for a comprehensive physical exam. The patient reports no problems.  She is doing well overall she says she is probably eating the healthiest she ever has in her life she has been going to the gym and working out regularly.  She is just a little frustrated because she is actually been gaining weight instead of losing she says the exercise has really ramped up her appetite.  She has been making sure that she is getting adequate protein in her diet.  Social History   Socioeconomic History   Marital status: Single    Spouse name: Not on file   Number of children: 1   Years of education: Not on file   Highest education level: Not on file  Occupational History   Occupation: police dispatcher    Employer: [employer]    Comment: [place]   Tobacco Use   Smoking status: Former    Types: Cigarettes    Quit date: 11/14/2007    Years since quitting: 13.6   Smokeless tobacco: Never  Vaping Use   Vaping Use: Never used  Substance and Sexual Activity   Alcohol use: No    Comment: once every couple of months.    Drug use: No   Sexual activity: Yes    Partners: Male    Birth control/protection: Patch    Comment:  HS diploma, 5 caffeine drinks daily,no regular exercise.  Other Topics Concern   Not on file  Social History Narrative   Exercise 3-4 days per week. 1-2 cups  Caffeine per day. .     Social Determinants of Health   Financial Resource Strain: Not on file  Food Insecurity: Not on file  Transportation Needs: Not on file  Physical Activity: Not on file  Stress: Not on file  Social Connections: Not on file  Intimate Partner Violence: Not on file   Health Maintenance  Topic Date Due   Hepatitis C Screening  Never done   COVID-19 Vaccine (1) 09/15/2021 (Originally 03/29/1995)   INFLUENZA VACCINE  02/10/2022 (Originally 06/13/2021)   PAP SMEAR-Modifier  08/11/2023   TETANUS/TDAP  01/17/2028   HIV Screening   Completed   Pneumococcal Vaccine 83-54 Years old  Aged Out   HPV VACCINES  Aged Out    The following portions of the patient's history were reviewed and updated as appropriate: allergies, current medications, past family history, past medical history, past social history, past surgical history, and problem list.  Review of Systems Pertinent items are noted in HPI.   Objective:    BP 122/77   Pulse 91   Ht 5\' 5"  (1.651 m)   Wt 202 lb (91.6 kg)   SpO2 99%   BMI 33.61 kg/m  General appearance: alert, cooperative, and appears stated age Head: Normocephalic, without obvious abnormality, atraumatic Eyes:  conj clear, EOMI< PEERLA Ears: normal TM's and external ear canals both ears Nose: Nares normal. Septum midline. Mucosa normal. No drainage or sinus tenderness. Throat: lips, mucosa, and tongue normal; teeth and gums normal Neck: no adenopathy, no carotid bruit, no JVD, supple, symmetrical, trachea midline, and thyroid not enlarged, symmetric, no tenderness/mass/nodules Back: symmetric, no curvature. ROM normal. No CVA tenderness. Lungs: clear to auscultation bilaterally Heart: regular rate and rhythm, S1, S2 normal, no murmur, click, rub or gallop Abdomen: soft, non-tender; bowel sounds normal; no masses,  no organomegaly Extremities: extremities normal, atraumatic, no cyanosis or  edema Pulses: 2+ and symmetric Skin: Skin color, texture, turgor normal. No rashes or lesions Lymph nodes: Cervical adenopathy: nl and Supraclavicular adenopathy: nl    Assessment:    Healthy female exam.      Plan:     See After Visit Summary for Counseling Recommendations  Keep up a regular exercise program and make sure you are eating a healthy diet Try to eat 4 servings of dairy a day, or if you are lactose intolerant take a calcium with vitamin D daily.  Your vaccines are up to date.   Orders Placed This Encounter  Procedures   Lipid panel   COMPLETE METABOLIC PANEL WITH GFR   TSH    Hemoglobin A1c     Am happy to discuss further treatment options for weight loss if in the next couple months she does not feel like the exercise and dieting is really helping.

## 2021-09-06 ENCOUNTER — Ambulatory Visit (INDEPENDENT_AMBULATORY_CARE_PROVIDER_SITE_OTHER): Payer: Self-pay | Admitting: Family Medicine

## 2021-09-06 ENCOUNTER — Encounter: Payer: Self-pay | Admitting: Family Medicine

## 2021-09-06 VITALS — BP 109/68 | HR 69 | Ht 65.0 in | Wt 206.0 lb

## 2021-09-06 DIAGNOSIS — K625 Hemorrhage of anus and rectum: Secondary | ICD-10-CM

## 2021-09-06 DIAGNOSIS — N912 Amenorrhea, unspecified: Secondary | ICD-10-CM

## 2021-09-06 MED ORDER — FLUCONAZOLE 150 MG PO TABS: 150.0000 mg | ORAL_TABLET | Freq: Every day | ORAL | 1 refills | Status: DC

## 2021-09-06 MED ORDER — HYDROCORTISONE 1 % EX CREA: 1.0000 "application " | TOPICAL_CREAM | Freq: Two times a day (BID) | CUTANEOUS | 0 refills | Status: AC

## 2021-09-06 MED ORDER — HYDROCORTISONE ACETATE 25 MG RE SUPP: 25.0000 mg | Freq: Two times a day (BID) | RECTAL | 99 refills | Status: AC

## 2021-09-06 NOTE — Progress Notes (Signed)
Established Patient Office Visit  Subjective:  Patient ID: [name], female    DOB: [birth year]  Age: 30 y.o. MRN: [id number]  CC:  Chief Complaint  Patient presents with   Hemorrhoids     HPI [name] presents for Weight Loss.  She says in the last year she is really put a lot of weight on she has been doing progesterone every 3 months to help her have a period.  He was unable to get recent labs drawn because they were unable to stick her.  They had tried multiple times.  Issues with rectal bleeding. .  She said years ago she had issues with hemorrhoids but has not in the last few years.  But more recently she feels like she has more of a tear.  Ever since she had her gallbladder out she has frequent stools she really tries to regulate that with her diet is much as she can.  But she has been noticing more and more often that she is having bleeding with her stools usually fresh blood sometimes it drips into the toilet sometimes just with wiping.  The last time she saw blood was yesterday.  And yesterday she was just really uncomfortable it is painful to have a bowel movement as well.  He says she can feel a tear present.  Past Medical History:  Diagnosis Date   Allergy    Anxiety    Depression    PE (pulmonary thromboembolism) ([facility]) 05/02/2017    Past Surgical History:  Procedure Laterality Date   CHOLECYSTECTOMY  08-2009    Family History  Problem Relation Age of Onset   Depression Mother    Diabetes Father    Hypertension Father    Hyperlipidemia Father    Depression Brother    Colon cancer Maternal Grandfather    Prostate cancer Maternal Grandfather    Melanoma Maternal Grandfather    Lung cancer Paternal Aunt        smoker     Social History   Socioeconomic History   Marital status: Single    Spouse name: Not on file   Number of children: 1   Years of education: Not on file   Highest education level: Not on file  Occupational History    Occupation: police Engineer, drilling: [employer]    Comment: [place] [employer] [place]   Tobacco Use   Smoking status: Former    Types: Cigarettes    Quit date: 11/14/2007    Years since quitting: 13.8   Smokeless tobacco: Never  Vaping Use   Vaping Use: Never used  Substance and Sexual Activity   Alcohol use: No    Comment: once every couple of months.    Drug use: No   Sexual activity: Yes    Partners: Male    Birth control/protection: Patch    Comment:  HS diploma, 5 caffeine drinks daily,no regular exercise.  Other Topics Concern   Not on file  Social History Narrative   Exercise 3-4 days per week. 1-2 cups  Caffeine per day. .     Social Determinants of Health   Financial Resource Strain: Not on file  Food Insecurity: Not on file  Transportation Needs: Not on file  Physical Activity: Not on file  Stress: Not on file  Social Connections: Not on file  Intimate Partner Violence: Not on file    Outpatient Medications Prior to Visit  Medication Sig Dispense Refill   cetirizine (  ZYRTEC) 10 MG tablet Take 10 mg by mouth daily.     medroxyPROGESTERone Acetate (PROVERA PO) Take by mouth.     Multiple Vitamins-Minerals (WOMENS MULTIVITAMIN PO) Take by mouth.     Probiotic Product (PROBIOTIC DAILY PO) Take by mouth.     No facility-administered medications prior to visit.    Allergies  Allergen Reactions   Latex Rash   Estrogens Other (See Comments)    Hx of PE on estrogen    ROS Review of Systems    Objective:    Physical Exam Vitals reviewed.  Constitutional:      Appearance: She is well-developed.  HENT:     Head: Normocephalic and atraumatic.  Eyes:     Conjunctiva/sclera: Conjunctivae normal.  Cardiovascular:     Rate and Rhythm: Normal rate.  Pulmonary:     Effort: Pulmonary effort is normal.  Genitourinary:      Comments: She does have a some mild erythema along the crease of the rectal area that is well demarcated.  I do not see a specific tear or  laceration or hemorrhoid.  Anoscope used to visualize internal rectal area again did not see a discrete lesion or active area of bleeding. Skin:    General: Skin is dry.  Neurological:     Mental Status: She is alert and oriented to person, place, and time.  Psychiatric:        Behavior: Behavior normal.    BP 109/68   Pulse 69   Ht 5\' 5"  (1.651 m)   Wt 206 lb (93.4 kg)   SpO2 100%   BMI 34.28 kg/m  Wt Readings from Last 3 Encounters:  09/06/21 206 lb (93.4 kg)  07/14/21 202 lb (91.6 kg)  08/30/20 188 lb (85.3 kg)     Health Maintenance Due  Topic Date Due   Hepatitis C Screening  Never done    There are no preventive care reminders to display for this patient.  Lab Results  Component Value Date   TSH 0.68 07/02/2019   Lab Results  Component Value Date   WBC 8.3 07/13/2020   HGB 14.2 07/13/2020   HCT 42.1 07/13/2020   MCV 88.6 07/13/2020   PLT 314 07/13/2020   Lab Results  Component Value Date   NA 140 09/02/2020   K 4.3 09/02/2020   CO2 22 09/02/2020   GLUCOSE 80 09/02/2020   BUN 9 09/02/2020   CREATININE 0.70 09/02/2020   BILITOT 0.6 09/02/2020   ALKPHOS 61 05/08/2019   AST 19 09/02/2020   ALT 23 09/02/2020   PROT 7.2 09/02/2020   ALBUMIN 4.0 05/08/2019   CALCIUM 9.5 09/02/2020   ANIONGAP 8 05/08/2019   Lab Results  Component Value Date   CHOL 167 09/02/2020   Lab Results  Component Value Date   HDL 46 (L) 09/02/2020   Lab Results  Component Value Date   LDLCALC 102 (H) 09/02/2020   Lab Results  Component Value Date   TRIG 96 09/02/2020   Lab Results  Component Value Date   CHOLHDL 3.6 09/02/2020   Lab Results  Component Value Date   HGBA1C 5.0 09/02/2020      Assessment & Plan:   Problem List Items Addressed This Visit   None Visit Diagnoses     Rectal bleeding    -  Primary   Relevant Orders   Ambulatory referral to Gastroenterology   Amenorrhea          Rectal bleeding-I did not see  any discrete tears or  hemorrhoids on exam.  Thus had like to refer to GI for further evaluation in the meantime we will treat with hydrocortisone suppositories and external cream.  The skin in the crease does look erythematous some get a treat for yeast infection as well.    amenorrhea-she is considering a second opinion to see what might be her options to help her regulate her period and not have to be on progesterone if possible.  She feels like it is contributing to some weight gain we did discuss that we can certainly look at some options to treat that.  And if she would like a second opinion to let me know more than happy to make referral if she would like 1  Meds ordered this encounter  Medications   hydrocortisone (ANUSOL-HC) 25 MG suppository    Sig: Place 1 suppository (25 mg total) rectally 2 (two) times daily.    Dispense:  24 suppository    Refill:  PRN   hydrocortisone cream 1 %    Sig: Apply 1 application topically 2 (two) times daily.    Dispense:  30 g    Refill:  0   fluconazole (DIFLUCAN) 150 MG tablet    Sig: Take 1 tablet (150 mg total) by mouth daily.    Dispense:  3 tablet    Refill:  1    Plan to redraw her labs here today.  He is fasting.   Follow-up: No follow-ups on file.    Nani Gasser, MD

## 2021-09-07 LAB — HEMOGLOBIN A1C
Hgb A1c MFr Bld: 5 % of total Hgb (ref <5.7)
Mean Plasma Glucose: 97 mg/dL
eAG (mmol/L): 5.4 mmol/L

## 2021-09-07 LAB — LIPID PANEL
Cholesterol: 193 mg/dL (ref <200)
HDL: 59 mg/dL (ref >=50)
LDL Cholesterol (Calc): 113 mg/dL (calc) — ABNORMAL HIGH
Non-HDL Cholesterol (Calc): 134 mg/dL (calc) — ABNORMAL HIGH (ref <130)
Total CHOL/HDL Ratio: 3.3 (calc) (ref <5.0)
Triglycerides: 105 mg/dL (ref <150)

## 2021-09-07 LAB — COMPLETE METABOLIC PANEL WITH GFR
AG Ratio: 1.2 (calc) (ref 1.0–2.5)
ALT: 33 U/L — ABNORMAL HIGH (ref 6–29)
AST: 23 U/L (ref 10–30)
Albumin: 4.4 g/dL (ref 3.6–5.1)
Alkaline phosphatase (APISO): 80 U/L (ref 31–125)
BUN: 11 mg/dL (ref 7–25)
CO2: 20 mmol/L (ref 20–32)
Calcium: 9.6 mg/dL (ref 8.6–10.2)
Chloride: 102 mmol/L (ref 98–110)
Creat: 0.69 mg/dL (ref 0.50–0.97)
Globulin: 3.6 g/dL (calc) (ref 1.9–3.7)
Glucose, Bld: 79 mg/dL (ref 65–99)
Potassium: 4 mmol/L (ref 3.5–5.3)
Sodium: 137 mmol/L (ref 135–146)
Total Bilirubin: 0.5 mg/dL (ref 0.2–1.2)
Total Protein: 8 g/dL (ref 6.1–8.1)
eGFR: 119 mL/min/{1.73_m2} (ref >=60)

## 2021-09-07 LAB — TSH: TSH: 1.61 mIU/L

## 2021-09-08 NOTE — Progress Notes (Signed)
Hi [name],  Your LDL cholesterol is a little elevated up from last year.  Just encourage you to continue work on healthy diet and regular exercise.  One of your liver enzymes is mildly elevated similar to in the past.  Again just work on [employer] and minimize processed foods.  Try to increase fiber, grains and lean proteins.  Thyroid level is normal.  Your A1c is also normal no sign of prediabetes or diabetes.

## 2021-11-28 ENCOUNTER — Other Ambulatory Visit: Payer: Self-pay

## 2021-11-28 ENCOUNTER — Ambulatory Visit (INDEPENDENT_AMBULATORY_CARE_PROVIDER_SITE_OTHER): Payer: Commercial Managed Care - PPO

## 2021-11-28 ENCOUNTER — Encounter: Payer: Self-pay | Admitting: Family Medicine

## 2021-11-28 ENCOUNTER — Ambulatory Visit (INDEPENDENT_AMBULATORY_CARE_PROVIDER_SITE_OTHER): Payer: Commercial Managed Care - PPO | Admitting: Family Medicine

## 2021-11-28 VITALS — BP 115/76 | HR 70 | Resp 16 | Ht 65.0 in | Wt 199.0 lb

## 2021-11-28 DIAGNOSIS — M7989 Other specified soft tissue disorders: Secondary | ICD-10-CM

## 2021-11-28 DIAGNOSIS — M79604 Pain in right leg: Secondary | ICD-10-CM

## 2021-11-28 NOTE — Progress Notes (Signed)
Hi [name], no deep clot noted.  It is what is called a superficial venous thrombus.  These are less worrisome and concerning.  Current recommendation is to take naproxen 500 mg twice a day or ibuprofen 400 mg every 4 hours.  I would also recommend some compression such as an Ace wrap.  It just needs to be snug but it does not need to be supertight.  Would like to recheck the area in about 10 days.  If at any point you feel like it is becoming more tender, sore, painful, red, or larger then please let me know immediately and I will switch you to a stronger blood thinner.

## 2021-11-28 NOTE — Progress Notes (Addendum)
Acute Office Visit  Subjective:    Patient ID: [name], female    DOB: 1990/12/10, 31 y.o.   MRN: [id number]  Chief Complaint  Patient presents with   Leg Pain    Behind right knee, getting worse, lump behind knee, discolored, 1 week     HPI Patient is in today for   Behind right knee, getting worse, lump behind knee, discolored, 1 week.  She is concerned about potential for DVT since she has a prior history of pulmonary embolism of the lingula artery.  She is at increased risk because of positive lupus anticoagulant and she was on birth control.  She did complete 2 year treatment on Xarelto.  She completed a course in June 2020.  At that time he is not currently on any medications that would increase her risk for DVT.  No recent long travel.  She does not remember any specific trauma or injury but she does have [place] farm at her home and does bump her self occasionally and just does not really remember.  She does have more discomfort when she first stands up it takes a few steps for it to stretch out and feel better it feels "tight".  Past Medical History:  Diagnosis Date   Allergy    Anxiety    Depression    PE (pulmonary thromboembolism) ([place]) 05/02/2017    Past Surgical History:  Procedure Laterality Date   CHOLECYSTECTOMY  08-2009    Family History  Problem Relation Age of Onset   Depression Mother    Diabetes Father    Hypertension Father    Hyperlipidemia Father    Depression Brother    Colon cancer Maternal Grandfather    Prostate cancer Maternal Grandfather    Melanoma Maternal Grandfather    Lung cancer Paternal Aunt        smoker     Social History   Socioeconomic History   Marital status: Single    Spouse name: Not on file   Number of children: 1   Years of education: Not on file   Highest education level: Not on file  Occupational History   Occupation: police Solicitor: [employer]    Comment: [place] [employer] [place]   Tobacco Use    Smoking status: Former    Types: Cigarettes    Quit date: 11/14/2007    Years since quitting: 14.0   Smokeless tobacco: Never  Vaping Use   Vaping Use: Never used  Substance and Sexual Activity   Alcohol use: No    Comment: once every couple of months.    Drug use: No   Sexual activity: Yes    Partners: Male    Birth control/protection: Patch    Comment:  HS diploma, 5 caffeine drinks daily,no regular exercise.  Other Topics Concern   Not on file  Social History Narrative   Exercise 3-4 days per week. 1-2 cups  Caffeine per day. .     Social Determinants of Health   Financial Resource Strain: Not on file  Food Insecurity: Not on file  Transportation Needs: Not on file  Physical Activity: Not on file  Stress: Not on file  Social Connections: Not on file  Intimate Partner Violence: Not on file    Outpatient Medications Prior to Visit  Medication Sig Dispense Refill   cetirizine (ZYRTEC) 10 MG tablet Take 10 mg by mouth daily.     hydrocortisone (ANUSOL-HC) 25 MG suppository Place  1 suppository (25 mg total) rectally 2 (two) times daily. 24 suppository PRN   hydrocortisone cream 1 % Apply 1 application topically 2 (two) times daily. 30 g 0   medroxyPROGESTERone Acetate (PROVERA PO) Take by mouth.     Multiple Vitamins-Minerals (WOMENS MULTIVITAMIN PO) Take by mouth.     Probiotic Product (PROBIOTIC DAILY PO) Take by mouth.     fluconazole (DIFLUCAN) 150 MG tablet Take 1 tablet (150 mg total) by mouth daily. 3 tablet 1   No facility-administered medications prior to visit.    Allergies  Allergen Reactions   Latex Rash   Estrogens Other (See Comments)    Hx of PE on estrogen    Review of Systems     Objective:    Physical Exam Vitals reviewed.  Constitutional:      Appearance: She is well-developed.  HENT:     Head: Normocephalic and atraumatic.  Eyes:     Conjunctiva/sclera: Conjunctivae normal.  Cardiovascular:     Rate and Rhythm: Normal rate.   Pulmonary:     Effort: Pulmonary effort is normal.  Musculoskeletal:     Comments: Along the medial right posterior knee there is an area of tenderness and swelling.  With a slight purple discoloration.  Nontender calf squeeze.  Skin:    General: Skin is dry.     Coloration: Skin is not pale.  Neurological:     Mental Status: She is alert and oriented to person, place, and time.  Psychiatric:        Behavior: Behavior normal.    BP 115/76    Pulse 70    Resp 16    Ht 5' 5"  (1.651 m)    Wt 199 lb (90.3 kg)    SpO2 99%    BMI 33.12 kg/m  Wt Readings from Last 3 Encounters:  11/28/21 199 lb (90.3 kg)  09/06/21 206 lb (93.4 kg)  07/14/21 202 lb (91.6 kg)    Health Maintenance Due  Topic Date Due   Hepatitis C Screening  Never done    There are no preventive care reminders to display for this patient.   Lab Results  Component Value Date   TSH 1.61 09/06/2021   Lab Results  Component Value Date   WBC 8.3 07/13/2020   HGB 14.2 07/13/2020   HCT 42.1 07/13/2020   MCV 88.6 07/13/2020   PLT 314 07/13/2020   Lab Results  Component Value Date   NA 137 09/06/2021   K 4.0 09/06/2021   CO2 20 09/06/2021   GLUCOSE 79 09/06/2021   BUN 11 09/06/2021   CREATININE 0.69 09/06/2021   BILITOT 0.5 09/06/2021   ALKPHOS 61 05/08/2019   AST 23 09/06/2021   ALT 33 (H) 09/06/2021   PROT 8.0 09/06/2021   ALBUMIN 4.0 05/08/2019   CALCIUM 9.6 09/06/2021   ANIONGAP 8 05/08/2019   EGFR 119 09/06/2021   Lab Results  Component Value Date   CHOL 193 09/06/2021   Lab Results  Component Value Date   HDL 59 09/06/2021   Lab Results  Component Value Date   LDLCALC 113 (H) 09/06/2021   Lab Results  Component Value Date   TRIG 105 09/06/2021   Lab Results  Component Value Date   CHOLHDL 3.3 09/06/2021   Lab Results  Component Value Date   HGBA1C 5.0 09/06/2021       Assessment & Plan:   Problem List Items Addressed This Visit   None Visit Diagnoses  Leg pain, right     -  Primary   Relevant Orders   US Venous Img Lower Unilateral Right (Completed)      Pain and swelling to the medial posterior right knee.  She does have a palpable area of swelling.  Consider hematoma versus superficial clot, versus deep venous thrombosis.  Will evaluate with Doppler for further work-up since she is high risk with prior history of pulmonary embolism.  Meantime recommend Tylenol, ice.  And possible compression after we get the ultrasound. Schedule for STAT at 3:30 today.   Meds ordered this encounter  Medications   naproxen (NAPROSYN) 500 MG tablet    Sig: Take 1 tablet (500 mg total) by mouth 2 (two) times daily with a meal.    Dispense:  60 tablet    Refill:  1     Beatrice Lecher, MD

## 2021-11-29 ENCOUNTER — Encounter: Payer: Self-pay | Admitting: Family Medicine

## 2021-11-29 MED ORDER — NAPROXEN 500 MG PO TABS: 500.0000 mg | ORAL_TABLET | Freq: Two times a day (BID) | ORAL | 1 refills | Status: DC

## 2021-11-29 NOTE — Telephone Encounter (Signed)
Yes, you are correct she could just take 2 ibuprofen to make the 400.  I did just go ahead and send a prescription for the 500 mg naproxen to make it more simple.

## 2021-11-29 NOTE — Addendum Note (Signed)
Addended by: Nani Gasser D on: 11/29/2021 02:23 PM   Modules accepted: Orders

## 2021-12-14 ENCOUNTER — Ambulatory Visit: Payer: Commercial Managed Care - PPO | Admitting: Family Medicine

## 2022-04-24 ENCOUNTER — Ambulatory Visit (INDEPENDENT_AMBULATORY_CARE_PROVIDER_SITE_OTHER): Payer: Commercial Managed Care - PPO

## 2022-04-24 ENCOUNTER — Encounter: Payer: Self-pay | Admitting: Family Medicine

## 2022-04-24 ENCOUNTER — Ambulatory Visit: Payer: Self-pay

## 2022-04-24 ENCOUNTER — Ambulatory Visit (INDEPENDENT_AMBULATORY_CARE_PROVIDER_SITE_OTHER): Payer: Commercial Managed Care - PPO | Admitting: Family Medicine

## 2022-04-24 VITALS — BP 138/90 | HR 82 | Ht 65.0 in | Wt 207.6 lb

## 2022-04-24 DIAGNOSIS — M79672 Pain in left foot: Secondary | ICD-10-CM

## 2022-04-24 DIAGNOSIS — M79674 Pain in right toe(s): Secondary | ICD-10-CM

## 2022-04-24 NOTE — Patient Instructions (Addendum)
Good to see you today.  Please get an Xray today before you leave.  Vit D3 over the counter 1000-5000 units daily.   Post op shoe or Turf toe isole.   Follow-up: 1 month

## 2022-04-24 NOTE — Progress Notes (Signed)
Subjective:    CC: L foot and R toe pain  I, [name], LAT, ATC, am serving as scribe for Dr. Clementeen Graham.  HPI: Pt is a 31 y/o female presenting w/ c/o chronic L foot and acute R 4th toe pain.    R 4th toe: Stubbed her toe on 04/20/22 when she hit it on an ottoman.  She has been having pain, swelling and bruising since.  L foot: Chronic pain since 2018.  Hx of stress fx and was in a cast.  Locates her pain to her L 1st and 2nd MT both dorsal and plantar. Swelling: yes Aggravating factors: constant pain but worse w/ prolonged weight-bearing activity Treatments tried: prior immobilization; Naproxen; ice; elevation  Pertinent review of Systems: No fevers or chills  Relevant historical information: Pulmonary embolism history.   Objective:    Vitals:   04/24/22 0958  BP: 138/90  Pulse: 82  SpO2: 96%   General: Well Developed, well nourished, and in no acute distress.   MSK: Right foot: Bruising at the distal fourth toe.  Tender palpation mildly.  Normal foot and ankle motion.  Pulses cap refill and sensation are intact.  Left foot: Minimal swelling dorsal forefoot medially.  Tender palpation mildly first and second metatarsals.  Normal foot and ankle motion. Slight ankle pronation and pes planus present.  Lab and Radiology Results  X-ray images left foot and right fourth toe obtained today personally and independently interpreted  Right fourth toe: No acute fractures are visible.  Left Foot: No acute fracture or significant DJD.   Await formal radiology review  Impression and Recommendations:    Assessment and Plan: 31 y.o. female with chronic left foot pain.  Patient had stress fracture first and second metatarsal in 2017.  She is never had 100% resolution of her symptoms after her foot immobilization.  Ended despite improving quite a bit.  Plan for 1 more arch support with scaphoid pads or arch supporting insoles.  We will check back in a month.  Also  recommend vitamin D.  Consider MRI in the future.  Left foot contusion: Fracture not visible per my read on x-ray today left fourth toe.  Await radiology review.  Buddy tape for now.  Postop shoe if needed.Marland Kitchen  PDMP not reviewed this encounter. Orders Placed This Encounter  Procedures   DG Toe 4th Right    Standing Status:   Future    Number of Occurrences:   1    Standing Expiration Date:   05/24/2022    Order Specific Question:   Reason for Exam (SYMPTOM  OR DIAGNOSIS REQUIRED)    Answer:   R 4th toe pain    Order Specific Question:   Is patient pregnant?    Answer:   No    Order Specific Question:   Preferred imaging location?    Answer:   [name]   DG Foot Complete Left    Standing Status:   Future    Number of Occurrences:   1    Standing Expiration Date:   05/24/2022    Order Specific Question:   Reason for Exam (SYMPTOM  OR DIAGNOSIS REQUIRED)    Answer:   L foot pain    Order Specific Question:   Is patient pregnant?    Answer:   No    Order Specific Question:   Preferred imaging location?    Answer:   [name]   No orders of the defined  types were placed in this encounter.   Discussed warning signs or symptoms. Please see discharge instructions. Patient expresses understanding.   The above documentation has been reviewed and is accurate and complete Lynne Leader, M.D.

## 2022-04-25 NOTE — Progress Notes (Signed)
Left foot x-ray looks normal to radiology.

## 2022-04-25 NOTE — Progress Notes (Signed)
Right 4th toe x-ray shows no fractures.

## 2022-05-05 ENCOUNTER — Encounter: Payer: Self-pay | Admitting: Family Medicine

## 2022-05-15 ENCOUNTER — Encounter: Payer: Commercial Managed Care - PPO | Admitting: Family Medicine

## 2022-05-15 ENCOUNTER — Encounter: Payer: Self-pay | Admitting: Family Medicine

## 2022-05-15 DIAGNOSIS — Z0000 Encounter for general adult medical examination without abnormal findings: Secondary | ICD-10-CM | POA: Insufficient documentation

## 2022-05-15 NOTE — Progress Notes (Deleted)
   Complete physical exam  Patient: [name]   DOB: Oct 20, 1991   31 y.o. Female  MRN: [id number]  Subjective:    No chief complaint on file.   [name] is a 31 y.o. female who presents today for a complete physical exam. She reports consuming a {diet types:17450} diet. {types:19826} She generally feels {DESC; WELL/FAIRLY WELL/POORLY:18703}. She reports sleeping {DESC; WELL/FAIRLY WELL/POORLY:18703}. She {does/does not:200015} have additional problems to discuss today.    Most recent fall risk assessment:    11/28/2021   11:33 AM  Fall Risk   Falls in the past year? 0  Number falls in past yr: 0  Injury with Fall? 0  Risk for fall due to : No Fall Risks  Follow up Falls prevention discussed;Falls evaluation completed     Most recent depression screenings:    11/28/2021   11:33 AM 09/06/2021   11:05 AM  PHQ 2/9 Scores  PHQ - 2 Score 0 0    {VISON DENTAL STD PSA (Optional):27386}  {History (Optional):23778}  Patient Care Team: Agapito Games, MD as PCP - General (Family Medicine) Noland Fordyce, MD as Consulting Physician (Obstetrics and Gynecology)   Outpatient Medications Prior to Visit  Medication Sig   cetirizine (ZYRTEC) 10 MG tablet Take 10 mg by mouth daily.   hydrocortisone (ANUSOL-HC) 25 MG suppository Place 1 suppository (25 mg total) rectally 2 (two) times daily.   hydrocortisone cream 1 % Apply 1 application topically 2 (two) times daily.   medroxyPROGESTERone Acetate (PROVERA PO) Take by mouth.   Multiple Vitamins-Minerals (WOMENS MULTIVITAMIN PO) Take by mouth.   Probiotic Product (PROBIOTIC DAILY PO) Take by mouth.   No facility-administered medications prior to visit.    ROS        Objective:     There were no vitals taken for this visit. {Vitals History (Optional):23777}  Physical Exam   No results found for any visits on 05/15/22. {Show previous labs (optional):23779}    Assessment & Plan:    Routine Health  Maintenance and Physical Exam  Immunization History  Administered Date(s) Administered   H1N1 02/10/2009   PFIZER(Purple Top)SARS-COV-2 Vaccination 11/19/2019, 01/14/2020   PPD Test 01/16/2018   Tdap 01/16/2018    Health Maintenance  Topic Date Due   Hepatitis C Screening  Never done   COVID-19 Vaccine (3 - Pfizer series) 03/10/2020   INFLUENZA VACCINE  06/13/2022   PAP SMEAR-Modifier  08/11/2023   TETANUS/TDAP  01/17/2028   HIV Screening  Completed   Pneumococcal Vaccine 90-18 Years old  Aged Out   HPV VACCINES  Aged Out    Discussed health benefits of physical activity, and encouraged her to engage in regular exercise appropriate for her age and condition.  Problem List Items Addressed This Visit       Other   Wellness examination - Primary   No follow-ups on file.     Nani Gasser, MD

## 2022-05-19 NOTE — Progress Notes (Deleted)
   I, Christoper Fabian, LAT, ATC, am serving as scribe for Dr. Clementeen Graham.  [name] is a 31 y.o. female who presents to [facility] today for f/u of chronic L foot pain and acute R 4th toe pain.  She was last seen by Dr. Denyse Amass on 04/24/22 and was provided an arch support w/ scaphoid pads and advised to buddy tape her toes.  She was also advised to use a post-op shoe for her R foot if the buddy taping was not helping enough and to take Vit D3.  Today, pt reports   Diagnostic testing: L foot and R 4th toe XR- 04/24/22  Pertinent review of systems: ***  Relevant historical information: ***   Exam:  There were no vitals taken for this visit. General: Well Developed, well nourished, and in no acute distress.   MSK: ***    Lab and Radiology Results No results found for this or any previous visit (from the past 72 hour(s)). No results found.     Assessment and Plan: 31 y.o. female with ***   PDMP not reviewed this encounter. No orders of the defined types were placed in this encounter.  No orders of the defined types were placed in this encounter.    Discussed warning signs or symptoms. Please see discharge instructions. Patient expresses understanding.   ***

## 2022-05-24 ENCOUNTER — Ambulatory Visit: Payer: Commercial Managed Care - PPO | Admitting: Family Medicine
